# Patient Record
Sex: Female | Born: 1985 | Race: Black or African American | Hispanic: No | Smoking: Never smoker
Health system: Southern US, Community
[De-identification: ages and names within clinical notes are randomized; demographics above are authoritative.]

## PROBLEM LIST (undated history)

## (undated) ENCOUNTER — Emergency Department (HOSPITAL_COMMUNITY): Admission: EM | Payer: 59 | Source: Home / Self Care

## (undated) ENCOUNTER — Inpatient Hospital Stay (HOSPITAL_COMMUNITY): Payer: Self-pay

## (undated) DIAGNOSIS — N83209 Unspecified ovarian cyst, unspecified side: Secondary | ICD-10-CM

## (undated) DIAGNOSIS — B977 Papillomavirus as the cause of diseases classified elsewhere: Secondary | ICD-10-CM

## (undated) DIAGNOSIS — F32A Depression, unspecified: Secondary | ICD-10-CM

## (undated) DIAGNOSIS — K5792 Diverticulitis of intestine, part unspecified, without perforation or abscess without bleeding: Secondary | ICD-10-CM

## (undated) DIAGNOSIS — K449 Diaphragmatic hernia without obstruction or gangrene: Secondary | ICD-10-CM

## (undated) DIAGNOSIS — N3010 Interstitial cystitis (chronic) without hematuria: Secondary | ICD-10-CM

## (undated) DIAGNOSIS — K589 Irritable bowel syndrome without diarrhea: Secondary | ICD-10-CM

## (undated) DIAGNOSIS — K851 Biliary acute pancreatitis without necrosis or infection: Secondary | ICD-10-CM

## (undated) DIAGNOSIS — N946 Dysmenorrhea, unspecified: Secondary | ICD-10-CM

## (undated) HISTORY — DX: Papillomavirus as the cause of diseases classified elsewhere: B97.7

## (undated) HISTORY — DX: Dysmenorrhea, unspecified: N94.6

## (undated) HISTORY — PX: CHOLECYSTECTOMY: SHX55

## (undated) HISTORY — DX: Unspecified ovarian cyst, unspecified side: N83.209

## (undated) HISTORY — PX: WISDOM TOOTH EXTRACTION: SHX21

## (undated) HISTORY — DX: Depression, unspecified: F32.A

## (undated) HISTORY — PX: CYSTOSCOPY: SUR368

## (undated) HISTORY — PX: NASAL ENDOSCOPY: SHX286

---
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
## 2003-01-29 ENCOUNTER — Other Ambulatory Visit: Admission: RE | Admit: 2003-01-29 | Discharge: 2003-01-29 | Payer: Self-pay | Admitting: Obstetrics and Gynecology

## 2003-04-05 HISTORY — PX: BLADDER SURGERY: SHX569

## 2003-06-23 ENCOUNTER — Emergency Department (HOSPITAL_COMMUNITY): Admission: EM | Admit: 2003-06-23 | Discharge: 2003-06-23 | Payer: Self-pay | Admitting: Emergency Medicine

## 2003-07-02 ENCOUNTER — Emergency Department (HOSPITAL_COMMUNITY): Admission: EM | Admit: 2003-07-02 | Discharge: 2003-07-02 | Payer: Self-pay | Admitting: Emergency Medicine

## 2003-08-22 ENCOUNTER — Emergency Department (HOSPITAL_COMMUNITY): Admission: EM | Admit: 2003-08-22 | Discharge: 2003-08-22 | Payer: Self-pay | Admitting: Emergency Medicine

## 2003-11-17 ENCOUNTER — Ambulatory Visit (HOSPITAL_COMMUNITY): Admission: RE | Admit: 2003-11-17 | Discharge: 2003-11-17 | Payer: Self-pay | Admitting: Urology

## 2003-11-17 ENCOUNTER — Ambulatory Visit (HOSPITAL_BASED_OUTPATIENT_CLINIC_OR_DEPARTMENT_OTHER): Admission: RE | Admit: 2003-11-17 | Discharge: 2003-11-17 | Payer: Self-pay | Admitting: Urology

## 2003-11-26 ENCOUNTER — Emergency Department (HOSPITAL_COMMUNITY): Admission: EM | Admit: 2003-11-26 | Discharge: 2003-11-26 | Payer: Self-pay | Admitting: Emergency Medicine

## 2003-12-31 ENCOUNTER — Emergency Department (HOSPITAL_COMMUNITY): Admission: EM | Admit: 2003-12-31 | Discharge: 2003-12-31 | Payer: Self-pay | Admitting: Emergency Medicine

## 2004-01-19 ENCOUNTER — Emergency Department (HOSPITAL_COMMUNITY): Admission: EM | Admit: 2004-01-19 | Discharge: 2004-01-20 | Payer: Self-pay | Admitting: Emergency Medicine

## 2004-01-30 ENCOUNTER — Other Ambulatory Visit: Admission: RE | Admit: 2004-01-30 | Discharge: 2004-01-30 | Payer: Self-pay | Admitting: Obstetrics and Gynecology

## 2004-05-31 ENCOUNTER — Emergency Department (HOSPITAL_COMMUNITY): Admission: EM | Admit: 2004-05-31 | Discharge: 2004-05-31 | Payer: Self-pay | Admitting: Emergency Medicine

## 2004-07-06 ENCOUNTER — Emergency Department (HOSPITAL_COMMUNITY): Admission: EM | Admit: 2004-07-06 | Discharge: 2004-07-06 | Payer: Self-pay | Admitting: Emergency Medicine

## 2004-07-22 ENCOUNTER — Encounter: Admission: RE | Admit: 2004-07-22 | Discharge: 2004-07-22 | Payer: Self-pay | Admitting: Internal Medicine

## 2004-09-10 ENCOUNTER — Emergency Department (HOSPITAL_COMMUNITY): Admission: EM | Admit: 2004-09-10 | Discharge: 2004-09-10 | Payer: Self-pay | Admitting: Emergency Medicine

## 2004-11-18 ENCOUNTER — Emergency Department (HOSPITAL_COMMUNITY): Admission: EM | Admit: 2004-11-18 | Discharge: 2004-11-18 | Payer: Self-pay | Admitting: *Deleted

## 2005-12-13 ENCOUNTER — Emergency Department (HOSPITAL_COMMUNITY): Admission: EM | Admit: 2005-12-13 | Discharge: 2005-12-14 | Payer: Self-pay | Admitting: Emergency Medicine

## 2007-05-04 ENCOUNTER — Emergency Department (HOSPITAL_COMMUNITY): Admission: EM | Admit: 2007-05-04 | Discharge: 2007-05-04 | Payer: Self-pay | Admitting: Emergency Medicine

## 2007-05-12 ENCOUNTER — Emergency Department (HOSPITAL_COMMUNITY): Admission: EM | Admit: 2007-05-12 | Discharge: 2007-05-13 | Payer: Self-pay | Admitting: Emergency Medicine

## 2007-12-25 ENCOUNTER — Emergency Department (HOSPITAL_COMMUNITY): Admission: EM | Admit: 2007-12-25 | Discharge: 2007-12-25 | Payer: Self-pay | Admitting: *Deleted

## 2007-12-27 ENCOUNTER — Emergency Department (HOSPITAL_COMMUNITY): Admission: EM | Admit: 2007-12-27 | Discharge: 2007-12-28 | Payer: Self-pay | Admitting: Emergency Medicine

## 2008-02-27 ENCOUNTER — Encounter: Admission: RE | Admit: 2008-02-27 | Discharge: 2008-02-27 | Payer: Self-pay | Admitting: Obstetrics and Gynecology

## 2008-03-19 ENCOUNTER — Ambulatory Visit (HOSPITAL_COMMUNITY): Admission: RE | Admit: 2008-03-19 | Discharge: 2008-03-19 | Payer: Self-pay | Admitting: Obstetrics and Gynecology

## 2008-04-30 ENCOUNTER — Other Ambulatory Visit: Admission: RE | Admit: 2008-04-30 | Discharge: 2008-04-30 | Payer: Self-pay | Admitting: Obstetrics and Gynecology

## 2008-07-03 ENCOUNTER — Emergency Department (HOSPITAL_COMMUNITY): Admission: EM | Admit: 2008-07-03 | Discharge: 2008-07-03 | Payer: Self-pay | Admitting: Emergency Medicine

## 2008-07-29 ENCOUNTER — Encounter: Admission: RE | Admit: 2008-07-29 | Discharge: 2008-07-29 | Payer: Self-pay | Admitting: Family Medicine

## 2008-11-18 IMAGING — CR DG FOOT COMPLETE 3+V*L*
3 series · 3 of 3 positions shown · non-contrast
Comparison: none

HISTORY: Foot injury, fall, bruising and pain at distal metatarsals

LEFT FOOT 3 VIEWS:
Minimally displaced oblique fracture distal fifth metatarsal.
No articular extension.
Joint spaces preserved.
No additional fracture, dislocation, or bone destruction.

[t foot ap left]
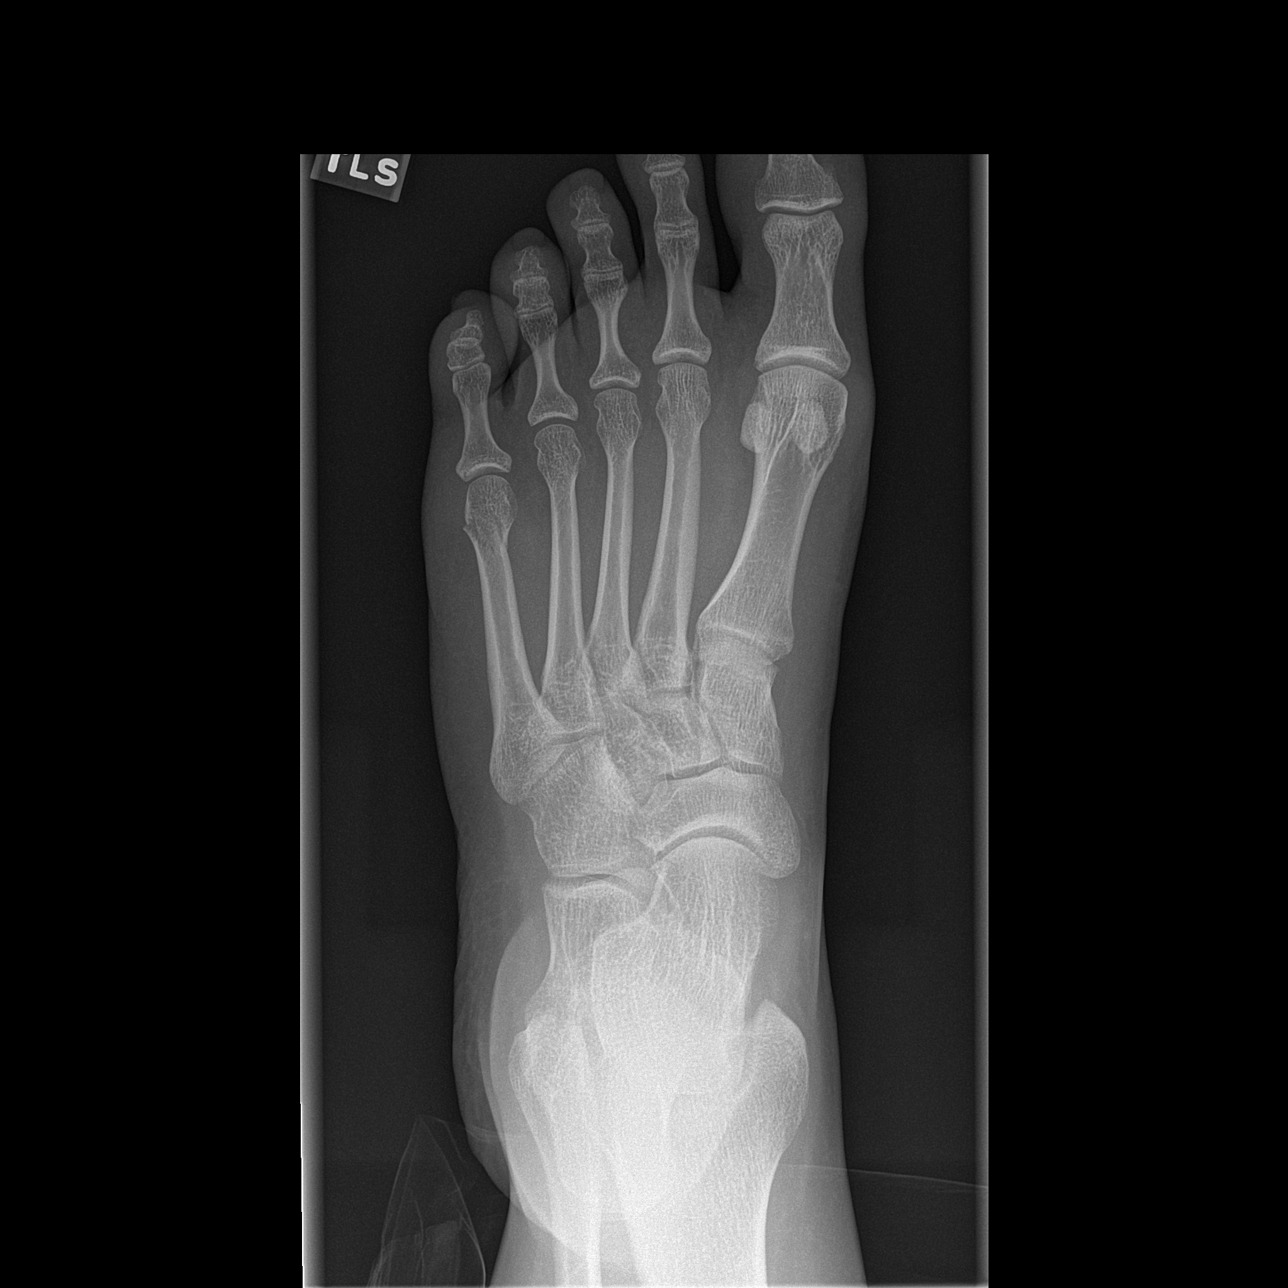

[t foot oblique left]
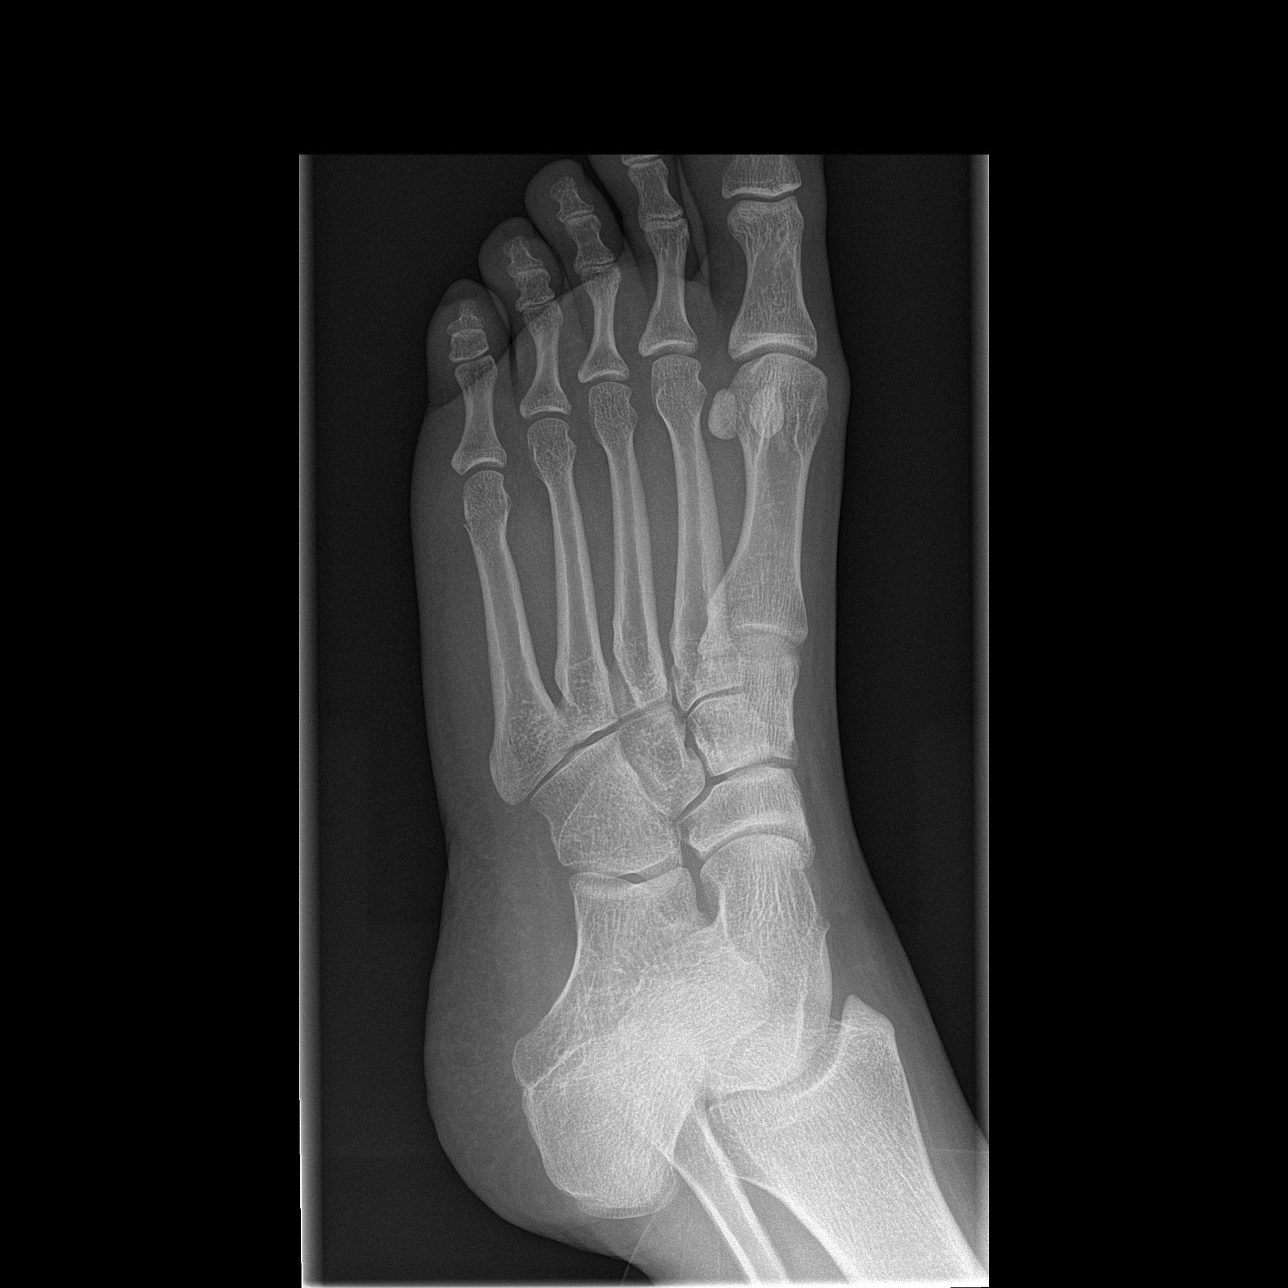

[t foot lat left]
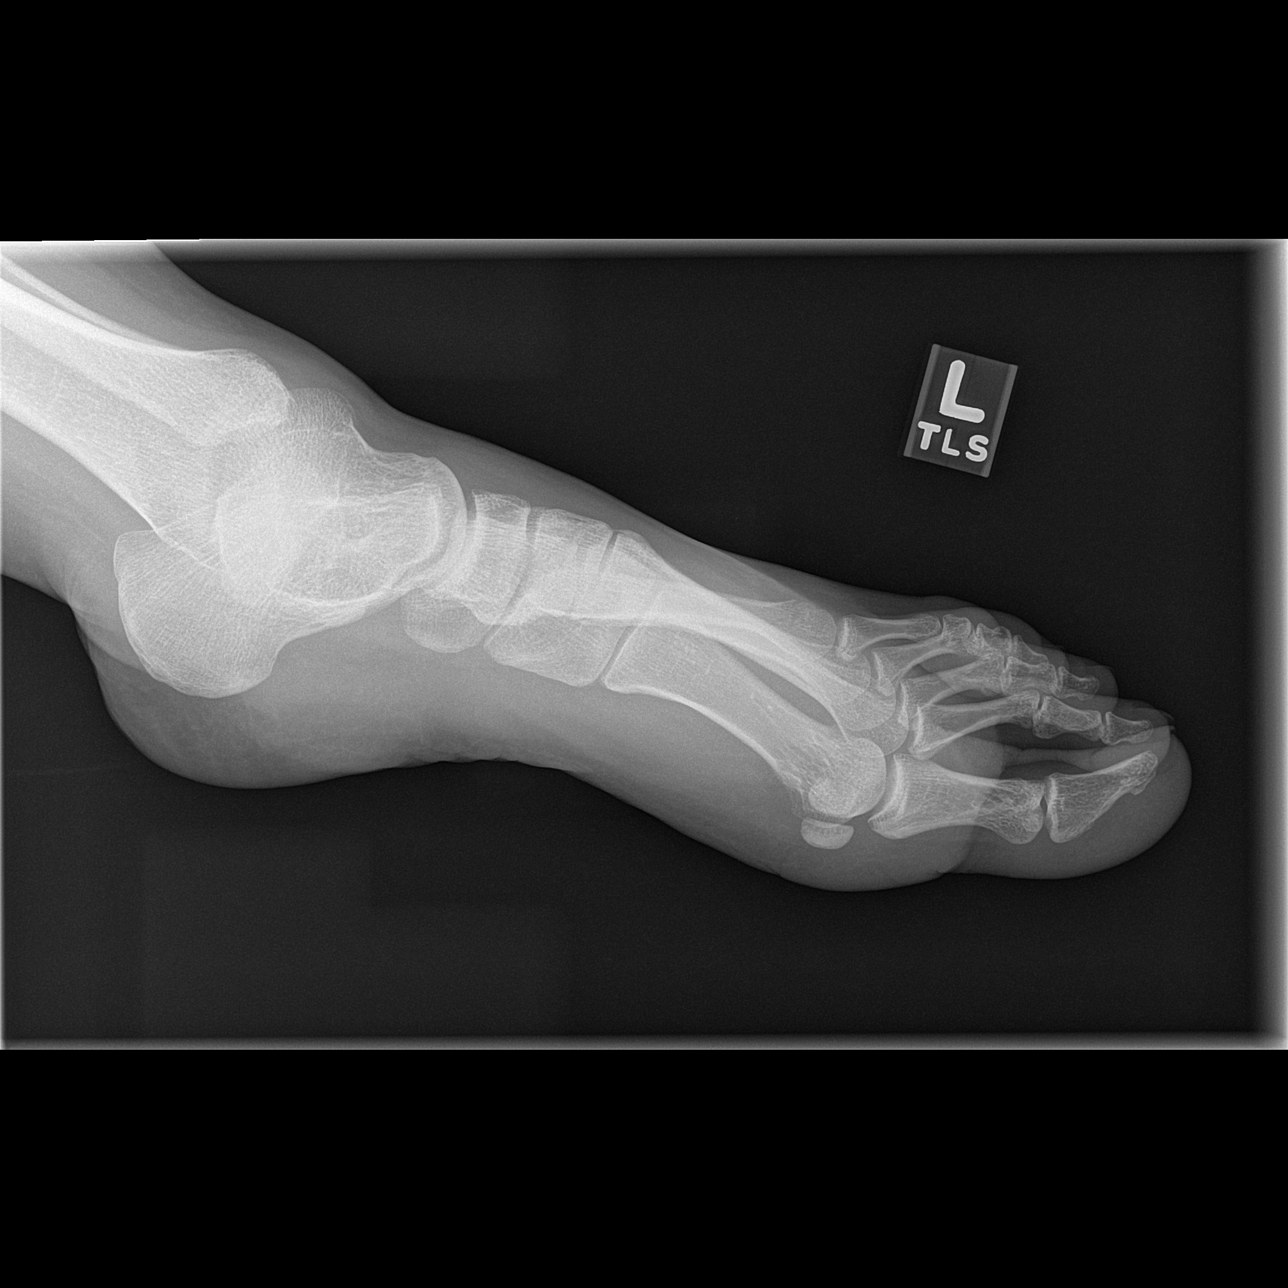

[3 of 3 positions shown; findings below may reference images not displayed]

IMPRESSION: Minimally displaced fracture distal left fifth metatarsal.

## 2009-04-09 ENCOUNTER — Other Ambulatory Visit: Admission: RE | Admit: 2009-04-09 | Discharge: 2009-04-09 | Payer: Self-pay | Admitting: Obstetrics and Gynecology

## 2010-04-25 ENCOUNTER — Encounter: Payer: Self-pay | Admitting: Obstetrics and Gynecology

## 2010-04-25 ENCOUNTER — Encounter: Payer: Self-pay | Admitting: Family Medicine

## 2010-07-04 HISTORY — PX: EXPLORATORY LAPAROTOMY: SUR591

## 2010-07-14 ENCOUNTER — Emergency Department (HOSPITAL_BASED_OUTPATIENT_CLINIC_OR_DEPARTMENT_OTHER)
Admission: EM | Admit: 2010-07-14 | Discharge: 2010-07-14 | Disposition: A | Discharge status: Home / Self Care | Payer: 59 | Attending: Emergency Medicine | Admitting: Emergency Medicine

## 2010-07-14 DIAGNOSIS — N898 Other specified noninflammatory disorders of vagina: Secondary | ICD-10-CM | POA: Insufficient documentation

## 2010-07-14 LAB — URINALYSIS, ROUTINE W REFLEX MICROSCOPIC
Bilirubin Urine: NEGATIVE
Glucose, UA: NEGATIVE mg/dL
Nitrite: NEGATIVE

## 2010-07-14 LAB — PREGNANCY, URINE: Preg Test, Ur: NEGATIVE

## 2010-07-22 ENCOUNTER — Ambulatory Visit (HOSPITAL_COMMUNITY)
Admission: RE | Admit: 2010-07-22 | Discharge: 2010-07-22 | Disposition: A | Discharge status: Home / Self Care | Payer: 59 | Source: Ambulatory Visit | Attending: Obstetrics and Gynecology | Admitting: Obstetrics and Gynecology

## 2010-07-22 ENCOUNTER — Other Ambulatory Visit: Payer: Self-pay | Admitting: Obstetrics and Gynecology

## 2010-07-22 DIAGNOSIS — N926 Irregular menstruation, unspecified: Secondary | ICD-10-CM | POA: Insufficient documentation

## 2010-07-22 DIAGNOSIS — N946 Dysmenorrhea, unspecified: Secondary | ICD-10-CM | POA: Insufficient documentation

## 2010-07-22 DIAGNOSIS — N803 Endometriosis of pelvic peritoneum, unspecified: Secondary | ICD-10-CM | POA: Insufficient documentation

## 2010-07-22 DIAGNOSIS — D259 Leiomyoma of uterus, unspecified: Secondary | ICD-10-CM | POA: Insufficient documentation

## 2010-07-22 LAB — CBC
Hemoglobin: 13.5 g/dL (ref 12.0–15.0)
MCV: 85.5 fL (ref 78.0–100.0)
RBC: 4.69 MIL/uL (ref 3.87–5.11)

## 2010-07-22 LAB — SURGICAL PCR SCREEN: MRSA, PCR: NEGATIVE

## 2010-07-22 LAB — PREGNANCY, URINE: Preg Test, Ur: NEGATIVE

## 2010-07-26 ENCOUNTER — Inpatient Hospital Stay (HOSPITAL_COMMUNITY)
Admission: AD | Admit: 2010-07-26 | Discharge: 2010-07-26 | Disposition: A | Discharge status: Home / Self Care | Payer: 59 | Source: Ambulatory Visit | Attending: Obstetrics and Gynecology | Admitting: Obstetrics and Gynecology

## 2010-07-26 DIAGNOSIS — IMO0002 Reserved for concepts with insufficient information to code with codable children: Secondary | ICD-10-CM | POA: Insufficient documentation

## 2010-07-26 LAB — CBC
HCT: 38.7 % (ref 36.0–46.0)
Hemoglobin: 13.7 g/dL (ref 12.0–15.0)
Platelets: 329 10*3/uL (ref 150–400)
RDW: 13 % (ref 11.5–15.5)
WBC: 12.6 10*3/uL — ABNORMAL HIGH (ref 4.0–10.5)

## 2010-07-26 LAB — URINALYSIS, ROUTINE W REFLEX MICROSCOPIC
Bilirubin Urine: NEGATIVE
Glucose, UA: NEGATIVE mg/dL
Ketones, ur: NEGATIVE mg/dL
Protein, ur: NEGATIVE mg/dL
Specific Gravity, Urine: 1.025 (ref 1.005–1.030)
Urobilinogen, UA: 0.2 mg/dL (ref 0.0–1.0)

## 2010-07-26 LAB — DIFFERENTIAL
Basophils Relative: 0 % (ref 0–1)
Eosinophils Relative: 2 % (ref 0–5)
Lymphs Abs: 3.7 10*3/uL (ref 0.7–4.0)
Monocytes Absolute: 1.2 10*3/uL — ABNORMAL HIGH (ref 0.1–1.0)

## 2010-08-12 NOTE — Op Note (Signed)
Toni Fields                ACCOUNT NO.:  [id number]  MEDICAL RECORD NO.:  [id number]           PATIENT TYPE:  O  LOCATION:  [facility]                          [facility]  PHYSICIAN:  Janine Limbo, M.D.DATE OF BIRTH:  05/19/1985  DATE OF PROCEDURE:  07/22/2010 DATE OF DISCHARGE:                              OPERATIVE REPORT   PREOPERATIVE DIAGNOSES: 1. Dysmenorrhea. 2. Body mass index equals 40. 3. Irregular menstrual cycles.  POSTOPERATIVE DIAGNOSES: 1. Dysmenorrhea. 2. Body mass index equals 40. 3. Irregular menstrual cycles. 4. Stage I endometriosis. 5. Fibroid uterus.  PROCEDURES: 1. Diagnostic laparoscopy. 2. Laparoscopic pelvic biopsies. 3. Laparoscopic fulguration of endometriosis. 4. Chromopertubation.  SURGEON:  Janine Limbo, MD  FIRST ASSISTANT:  None.  ANESTHETIC:  General.  DISPOSITION:  Toni Fields is a 25 year old female, gravida 0, who presents with the above-mentioned diagnoses.  She has noted an increase in her dysmenorrhea.  She understands the indications for her surgical procedure and she accepts the risks of, but not limited to, anesthetic complications, bleeding, infection, and possible damage to the surrounding organs.  FINDINGS:  On laparoscopy, the patient was noted to have the uterus that was normal size and shape.  There was a 0.5-cm and 0.3-cm raised lesion on the left posterior fundus of the uterus and these were consistent with very small fibroids.  The fallopian tubes were normal bilaterally. The fimbriated end of the fallopian tubes were delicate.  The ovaries were normal in size and appearance bilaterally.  The anterior cul-de-sac was free.  The posterior cul-de-sac was free.  No adhesions were noted. There were 3 hyperpigmented lesions in the left posterior cul-de-sac measuring less than 0.3 cm in size.  These lesions were consistent with endometriosis.  There was a 0.5-cm hyperpigmented lesion in the  right posterior cul-de-sac that was slightly raised.  This was consistent with endometriosis.  There was a 0.4-cm lesion on the right pelvic sidewall that was hyperpigmented and it too was consistent with endometriosis. Biopsies were taken of all these areas.  The appendix appeared normal. The gallbladder appeared normal.  The bowel appeared normal.  There was no evidence of endometriosis along the bowel or along the appendix.  On chromopertubation, the fallopian tubes quickly filled with blue dye and dye easily spilled from both fimbriated ends.  PROCEDURE:  The patient was taken to the operating room where a general anesthetic was given.  The patient's abdomen was prepped with ChloraPrep.  The perineum and vagina were prepped with multiple layers of Betadine.  Examination under anesthesia was performed.  A catheter was placed in the patient's bladder.  An acorn cannula was placed inside the cervix.  The patient was then sterilely draped.  The subumbilical area was injected with 4 mL of 0.5% Marcaine with epinephrine.  An incision was made and the Veress needle was inserted into the peritoneal cavity without difficulty.  Proper placement was confirmed using the saline drop test.  A pneumoperitoneum was then obtained.  The laparoscopic trocar and the laparoscope were substituted for the Veress needle.  The pelvic structures were visualized with findings as mentioned above.  The left lower quadrant of the abdomen was injected with 3 mL of 0.5% Marcaine with epinephrine.  A small incision was made and a 5-mm trocar was placed in the lower abdomen under direct visualization.  Again, the pelvic structures were carefully identified. Care was taken not to damage any of the vital pelvic structures or the bowel.  The endometriosis mentioned above was identified.  Pictures were taken.  We then hydrodissected the peritoneum where endometriosis was identified.  Biopsies were taken removing the  lesions that we mentioned above.  We then used the bipolar cautery to cauterize the peritoneal surfaces where endometriosis was thought to exist.  Care was taken not to damage any of the vital structures.  We did not cauterize the bowel. Again, pictures were taken.  At this point, we injected dye into the acorn cannula and dye quickly filled both fallopian tubes and quickly spilled through the fimbriated ends.  Pictures were taken.  We injected a total of 30 mL of dye.  The fluid was then aspirated from the cul-de- sac.  Hemostasis was confirmed throughout.  No additional endometriosis could be seen.  We felt we were ready to end our procedure.  The lower abdominal trocar was removed under direct visualization.  The pneumoperitoneum was allowed to escape.  The subumbilical trocar was then removed under direct visualization.  There was no evidence of herniation of bowel.  The fascia from the subumbilical incision was closed using an interrupted suture of 2-0 Vicryl.  All skin incisions were closed using 3-0 Monocryl.  Dermabond was placed on the skin incisions.  Sponge, needle, and instrument counts were correct on two occasions.  Estimated blood loss for the procedure was less than 5 mL. The patient was awakened from her anesthetic without difficulty and she was transported to the recovery room in stable condition.  She did receive 30 mg of Toradol intramuscularly and 30 mg of Toradol intravenously at the end of the procedure.  The biopsy from the right posterior cul-de-sac, biopsy from the left posterior cul-de-sac, and the biopsy from the right pelvic sidewall wall were all sent to Pathology for evaluation.  FOLLOWUP INSTRUCTIONS:  The patient will return to see Dr. Stefano Gaul in 2 weeks for followup examination.  She was given a copy of the postoperative instruction sheet as prepared by the [facility] for patients who have undergone laparoscopy.  She will call for  questions or concerns.  She was given a prescription for: 1. Motrin 800 mg every 8 hours as needed for mild to moderate pain. 2. Percocet 5/325 one or two tablets every 4 hours as needed for     severe pain. 3. Phenergan 25 mg 1 tablet every 6 hours as needed for nausea.     Janine Limbo, M.D.     AVS/MEDQ  D:  07/22/2010  T:  07/22/2010  Job:  [id number]  Electronically Signed by Kirkland Hun M.D. on 08/12/2010 03:14:07 AM

## 2010-08-12 NOTE — H&P (Signed)
  NAMESHARAY, BELLISSIMO                ACCOUNT NO.:  [id number]  MEDICAL RECORD NO.:  [id number]           PATIENT TYPE:  LOCATION:                                [facility]ILITY:  [facility]  PHYSICIAN:  Janine Limbo, M.D.DATE OF BIRTH:  1985/12/06  DATE OF ADMISSION:  07/22/2010 DATE OF DISCHARGE:                             HISTORY & PHYSICAL   DIAGNOSIS:  Obesity.  HISTORY OF PRESENT ILLNESS:  Toni Fields is a 25 year old female, gravida 0, who presents for a diagnostic laparoscopy with chromopertubation. The patient has been followed at the [facility].  The patient complains of increasing dysmenorrhea that has not responded to nonsteroidal anti-inflammatory agents.  The patient also has a history of irregular menstrual cycle and infertility.  The patient has been evaluated by a reproductive endocrinologist.  The patient has had negative gonorrhea and chlamydia cultures.  The patient has a past history of painful urethral syndrome and a past history of irritable bowel syndrome.  The patient has been told in the past that she has polycystic ovary syndrome.  DRUG ALLERGIES:  The patient reports that she is allergic to SULFA MEDICATIONS and AUGMENTIN.  PAST MEDICAL HISTORY:  Please see history of present illness.  The patient is obese.  Patient has a history of hidradenitis.  SOCIAL HISTORY:  The patient denies cigarette use and recreational drug use other than social alcohol.  She is currently married.  REVIEW OF SYSTEMS:  Please see history of present illness.  FAMILY HISTORY:  The patient has a family history of heart disease, asthma, thyroid dysfunction, cancer, hypertension, diabetes, mental and emotional problems, liver problems, and headaches.  PHYSICAL EXAMINATION:  VITAL SIGNS:  Weight is 245 pounds.  Height is 5 feet 6 inches.  BMI is approximately 40. HEENT:  Within normal limits except for an increased amount of hair  on her face and chin.  Thyroid is within normal limits. CHEST:  Clear. HEART:  Regular rate and rhythm. BREASTS:  Without masses.  The patient has healed scars under her breasts. ABDOMEN:  Nontender. EXTREMITIES:  Grossly normal. NEUROLOGIC:  Grossly normal. PELVIC:  External genitalia is normal.  Vagina is normal.  Cervix is nontender.  Uterus is normal size, shape and consistency.  Adnexa, no masses.  ASSESSMENT: 1. Increasing dysmenorrhea. 2. Irregular menstrual cycles. 3. Infertility.  PLAN:  The patient will undergo a diagnostic laparoscopy.  She understands the indications for her surgical procedure and she accepts the risks, but not limited to, anesthetic complications, bleeding, infections, and possible damage to the surrounding organs.     Janine Limbo, M.D.     AVS/MEDQ  D:  07/21/2010  T:  07/21/2010  Job:  [id number]  Electronically Signed by Kirkland Hun M.D. on 08/12/2010 03:13:55 AM

## 2010-08-20 NOTE — Op Note (Signed)
NAME:  Toni Fields, Toni Fields                           ACCOUNT NO.:  [id number]   MEDICAL RECORD NO.:  [id number]                   PATIENT TYPE:  AMB   LOCATION:  [facility]                                 FACILITY:  [facility]   PHYSICIAN:  Lindaann Slough, M.D.               DATE OF BIRTH:  1985/09/16   DATE OF PROCEDURE:  11/17/2003  DATE OF DISCHARGE:                                 OPERATIVE REPORT   PREOPERATIVE DIAGNOSIS:  Pelvic pain.   POSTOPERATIVE DIAGNOSIS:  Pelvic pain plus interstitial cystitis.   OPERATION PERFORMED:  Cystoscopy and hydraulic bladder distention and  ureteral dilation.   SURGEON:  Lindaann Slough, M.D.   ANESTHESIA:  General.   INDICATIONS FOR PROCEDURE:  The patient is a 25 year old female who has been  complaining of suprapubic pain, frequency, urgency and sensation of  incomplete emptying of the bladder.  She was treated with antibiotics  without any improvement.  She is scheduled today for cystoscopy and  hydraulic bladder distention.   DESCRIPTION OF PROCEDURE:  Under general anesthesia, the patient was prepped  and draped and placed in the dorsal lithotomy position.  A #22 [place]  cystoscope was inserted in the bladder. The bladder mucosa was reddened.  There was evidence of submucosal glomerulerations.  There is no stone or  tumor in the bladder.  Uteral orifices are normal in position and shape with  clear efflux.  The bladder was then distended for about 10 minutes.  The  bladder capacity is about 800 mL.   The cystoscope was removed.  The urethra was then dilated with a #30 [place],  then 400 mg of Pyridium in 20 mL of Marcaine were instilled in the bladder.  The patient tolerated the procedure well and left the operating room in  satisfactory condition to post anesthesia care unit.                                               Lindaann Slough, M.D.    MN/MEDQ  D:  11/17/2003  T:  11/17/2003  Job:  [id number]   cc:   [facility] ob-[facility]yn

## 2010-09-14 ENCOUNTER — Ambulatory Visit
Admission: RE | Admit: 2010-09-14 | Discharge: 2010-09-14 | Disposition: A | Discharge status: Home / Self Care | Payer: 59 | Source: Ambulatory Visit | Attending: Family Medicine | Admitting: Family Medicine

## 2010-09-14 ENCOUNTER — Other Ambulatory Visit: Payer: Self-pay | Admitting: Family Medicine

## 2010-09-14 DIAGNOSIS — K5792 Diverticulitis of intestine, part unspecified, without perforation or abscess without bleeding: Secondary | ICD-10-CM

## 2010-09-14 MED ORDER — IOHEXOL 300 MG/ML  SOLN
125.0000 mL | Freq: Once | INTRAMUSCULAR | Status: AC | PRN
  Administered 2010-09-14: 125 mL via INTRAVENOUS

## 2010-09-17 ENCOUNTER — Other Ambulatory Visit (HOSPITAL_COMMUNITY): Payer: Self-pay | Admitting: Family Medicine

## 2010-09-24 ENCOUNTER — Other Ambulatory Visit: Payer: Self-pay | Admitting: Gastroenterology

## 2010-09-27 ENCOUNTER — Ambulatory Visit
Admission: RE | Admit: 2010-09-27 | Discharge: 2010-09-27 | Disposition: A | Discharge status: Home / Self Care | Payer: 59 | Source: Ambulatory Visit | Attending: Gastroenterology | Admitting: Gastroenterology

## 2010-10-04 ENCOUNTER — Ambulatory Visit (HOSPITAL_COMMUNITY)
Admission: RE | Admit: 2010-10-04 | Discharge: 2010-10-04 | Disposition: A | Discharge status: Home / Self Care | Payer: 59 | Source: Ambulatory Visit | Attending: Family Medicine | Admitting: Family Medicine

## 2010-10-04 ENCOUNTER — Encounter (HOSPITAL_COMMUNITY): Payer: Self-pay

## 2010-10-04 DIAGNOSIS — R109 Unspecified abdominal pain: Secondary | ICD-10-CM | POA: Insufficient documentation

## 2010-10-04 MED ORDER — SINCALIDE 5 MCG IJ SOLR: 0.0200 ug/kg | Freq: Once | INTRAMUSCULAR | Status: DC

## 2010-10-04 MED ORDER — TECHNETIUM TC 99M MEBROFENIN IV KIT
5.5000 | PACK | Freq: Once | INTRAVENOUS | Status: AC | PRN
  Administered 2010-10-04: 5.5 via INTRAVENOUS

## 2010-11-24 ENCOUNTER — Emergency Department (HOSPITAL_COMMUNITY)
Admission: EM | Admit: 2010-11-24 | Discharge: 2010-11-24 | Disposition: A | Discharge status: Home / Self Care | Payer: 59 | Attending: Emergency Medicine | Admitting: Emergency Medicine

## 2010-11-24 DIAGNOSIS — K2970 Gastritis, unspecified, without bleeding: Secondary | ICD-10-CM | POA: Insufficient documentation

## 2010-11-24 DIAGNOSIS — R1013 Epigastric pain: Secondary | ICD-10-CM | POA: Insufficient documentation

## 2010-11-24 LAB — DIFFERENTIAL
Basophils Relative: 0 % (ref 0–1)
Eosinophils Absolute: 0.2 10*3/uL (ref 0.0–0.7)
Lymphs Abs: 2.6 10*3/uL (ref 0.7–4.0)
Monocytes Absolute: 1.1 10*3/uL — ABNORMAL HIGH (ref 0.1–1.0)
Monocytes Relative: 9 % (ref 3–12)

## 2010-11-24 LAB — COMPREHENSIVE METABOLIC PANEL
ALT: 14 U/L (ref 0–35)
AST: 13 U/L (ref 0–37)
CO2: 25 mEq/L (ref 19–32)
Calcium: 9.3 mg/dL (ref 8.4–10.5)
Creatinine, Ser: 0.51 mg/dL (ref 0.50–1.10)
GFR calc Af Amer: >60 mL/min (ref >60)
GFR calc non Af Amer: >60 mL/min (ref >60)
Glucose, Bld: 81 mg/dL (ref 70–99)
Sodium: 141 mEq/L (ref 135–145)
Total Protein: 6.7 g/dL (ref 6.0–8.3)

## 2010-11-24 LAB — URINALYSIS, ROUTINE W REFLEX MICROSCOPIC
Bilirubin Urine: NEGATIVE
Glucose, UA: NEGATIVE mg/dL
Hgb urine dipstick: NEGATIVE
Ketones, ur: 40 mg/dL — AB
Protein, ur: NEGATIVE mg/dL
Urobilinogen, UA: 0.2 mg/dL (ref 0.0–1.0)

## 2010-11-24 LAB — CBC
MCH: 30 pg (ref 26.0–34.0)
MCHC: 34.9 g/dL (ref 30.0–36.0)
MCV: 85.9 fL (ref 78.0–100.0)
Platelets: 301 10*3/uL (ref 150–400)
RBC: 4.47 MIL/uL (ref 3.87–5.11)

## 2010-11-25 LAB — POCT PREGNANCY, URINE: Preg Test, Ur: NEGATIVE

## 2011-01-03 LAB — CSF CELL COUNT WITH DIFFERENTIAL
Tube #: 1
WBC, CSF: 0
WBC, CSF: 1

## 2011-01-03 LAB — CBC
Platelets: 308
RDW: 13.5
WBC: 10

## 2011-01-03 LAB — CSF CULTURE W GRAM STAIN
Culture: NO GROWTH
Gram Stain: NONE SEEN

## 2011-01-03 LAB — URINALYSIS, ROUTINE W REFLEX MICROSCOPIC
Glucose, UA: NEGATIVE
Ketones, ur: 40 — AB
Nitrite: NEGATIVE
Protein, ur: NEGATIVE
Specific Gravity, Urine: 1.023
pH: 6

## 2011-01-03 LAB — APTT: aPTT: 34

## 2011-01-03 LAB — POCT PREGNANCY, URINE: Preg Test, Ur: NEGATIVE

## 2011-01-03 LAB — DIFFERENTIAL
Basophils Absolute: 0
Lymphocytes Relative: 32
Lymphs Abs: 3.2
Neutro Abs: 5.8
Neutrophils Relative %: 58

## 2011-01-03 LAB — PROTIME-INR
INR: 1.1
Prothrombin Time: 14.1

## 2011-01-03 LAB — GRAM STAIN

## 2011-01-03 LAB — BASIC METABOLIC PANEL
CO2: 25
Calcium: 9.8
Creatinine, Ser: 0.66
GFR calc Af Amer: >60
GFR calc non Af Amer: >60
Sodium: 140

## 2011-01-03 LAB — URINE MICROSCOPIC-ADD ON

## 2011-01-03 LAB — PROTEIN AND GLUCOSE, CSF: Total  Protein, CSF: 21

## 2011-03-15 ENCOUNTER — Encounter (HOSPITAL_BASED_OUTPATIENT_CLINIC_OR_DEPARTMENT_OTHER): Payer: Self-pay | Admitting: *Deleted

## 2011-03-15 ENCOUNTER — Emergency Department (HOSPITAL_BASED_OUTPATIENT_CLINIC_OR_DEPARTMENT_OTHER)
Admission: EM | Admit: 2011-03-15 | Discharge: 2011-03-15 | Disposition: A | Discharge status: Home / Self Care | Payer: Managed Care, Other (non HMO) | Attending: Emergency Medicine | Admitting: Emergency Medicine

## 2011-03-15 DIAGNOSIS — B9789 Other viral agents as the cause of diseases classified elsewhere: Secondary | ICD-10-CM | POA: Insufficient documentation

## 2011-03-15 DIAGNOSIS — K589 Irritable bowel syndrome without diarrhea: Secondary | ICD-10-CM | POA: Insufficient documentation

## 2011-03-15 DIAGNOSIS — B349 Viral infection, unspecified: Secondary | ICD-10-CM

## 2011-03-15 DIAGNOSIS — R197 Diarrhea, unspecified: Secondary | ICD-10-CM | POA: Insufficient documentation

## 2011-03-15 DIAGNOSIS — Z79899 Other long term (current) drug therapy: Secondary | ICD-10-CM | POA: Insufficient documentation

## 2011-03-15 HISTORY — DX: Interstitial cystitis (chronic) without hematuria: N30.10

## 2011-03-15 HISTORY — DX: Irritable bowel syndrome, unspecified: K58.9

## 2011-03-15 HISTORY — DX: Diaphragmatic hernia without obstruction or gangrene: K44.9

## 2011-03-15 LAB — URINALYSIS, ROUTINE W REFLEX MICROSCOPIC
Bilirubin Urine: NEGATIVE
Glucose, UA: NEGATIVE mg/dL
Hgb urine dipstick: NEGATIVE
Ketones, ur: NEGATIVE mg/dL
Nitrite: NEGATIVE
Specific Gravity, Urine: 1.027 (ref 1.005–1.030)
pH: 6 (ref 5.0–8.0)

## 2011-03-15 LAB — BASIC METABOLIC PANEL
CO2: 23 mEq/L (ref 19–32)
Chloride: 106 mEq/L (ref 96–112)
GFR calc non Af Amer: >90 mL/min (ref >90)
Glucose, Bld: 85 mg/dL (ref 70–99)
Potassium: 3.6 mEq/L (ref 3.5–5.1)
Sodium: 138 mEq/L (ref 135–145)

## 2011-03-15 MED ORDER — ACETAMINOPHEN 500 MG PO TABS
1000.0000 mg | ORAL_TABLET | Freq: Once | ORAL | Status: AC
  Administered 2011-03-15: 1000 mg via ORAL
  Filled 2011-03-15: qty 2

## 2011-03-15 NOTE — ED Notes (Signed)
I took urine sample to lab.

## 2011-03-15 NOTE — ED Provider Notes (Signed)
History     CSN: [id number] Arrival date & time: 03/15/2011 10:22 AM   First MD Initiated Contact with Patient 03/15/11 1058      Chief Complaint  Patient presents with  . Diarrhea    body aches chills sinus headache    (Consider location/radiation/quality/duration/timing/severity/associated sxs/prior treatment) HPI Complains of diarrhea and headache onset 2 days ago . Associated symptoms include diffuse myalgias and chills no known fever no bowel pain no other complaint. Treated herself with Tylenol 6 AM this morning with transient relief nothing makes symptoms better or worse. No nausea or vomiting. No other complaint. No other associated symptom Past Medical History  Diagnosis Date  . Abdominal pain   . IBS (irritable bowel syndrome)   . Hernia, hiatal   . Migraine   . Endometriosis   . IC (interstitial cystitis)     Past Surgical History  Procedure Date  . Exploratory laparotomy 4/12    endometrosis  . Cystoscopy     No family history on file.  History  Substance Use Topics  . Smoking status: Former Games developer  . Smokeless tobacco: Not on file  . Alcohol Use: No    OB History    Grav Para Term Preterm Abortions TAB SAB Ect Mult Living                  Review of Systems  Constitutional: Negative.   HENT: Negative.   Respiratory: Negative.   Cardiovascular: Negative.   Gastrointestinal: Positive for diarrhea. Negative for blood in stool.  Musculoskeletal: Negative.  Negative for myalgias.  Skin: Negative.   Neurological:       Headache  Hematological: Negative.   Psychiatric/Behavioral: Negative.   All other systems reviewed and are negative.    Allergies  Augmentin and Sulfa antibiotics  Home Medications   Current Outpatient Rx  Name Route Sig Dispense Refill  . ACETAMINOPHEN 500 MG PO TABS Oral Take 1,000 mg by mouth every 6 (six) hours as needed. For headache     . OMEPRAZOLE 10 MG PO CPDR Oral Take 10 mg by mouth daily as needed. For  heartburn    . PRENATAL FORMULA PO Oral Take 1 tablet by mouth daily.      . SUCRALFATE 1 G PO TABS Oral Take 1 g by mouth 4 (four) times daily as needed. For IBS    . TRAMADOL HCL 100 MG PO TB24 Oral Take 100 mg by mouth daily as needed. For menstral pain      BP 120/75  Pulse 73  Temp(Src) 97.9 F (36.6 C) (Oral)  Resp 18  Ht 5\' 6"  (1.676 m)  Wt 225 lb (102.059 kg)  BMI 36.32 kg/m2  SpO2 99%  LMP 02/27/2011  Physical Exam  Nursing note and vitals reviewed. Constitutional: She appears well-developed and well-nourished.  HENT:  Head: Normocephalic and atraumatic.  Eyes: Conjunctivae and EOM are normal. Pupils are equal, round, and reactive to light.  Neck: Neck supple. No tracheal deviation present. No thyromegaly present.  Cardiovascular: Normal rate and regular rhythm.   No murmur heard. Pulmonary/Chest: Effort normal and breath sounds normal.  Abdominal: Soft. Bowel sounds are normal. She exhibits no distension. There is no tenderness.       obese  Musculoskeletal: Normal range of motion. She exhibits no edema and no tenderness.  Lymphadenopathy:    She has no cervical adenopathy.  Neurological: She is alert. Coordination normal.  Skin: Skin is warm and dry. No rash noted.  Psychiatric: She  has a normal mood and affect.    ED Course  Procedures (including critical care time) 12:30 PM Feels improved after treatment with Tylenol  Labs Reviewed  URINALYSIS, ROUTINE W REFLEX MICROSCOPIC  PREGNANCY, URINE   No results found.   No diagnosis found.  Results for orders placed during the hospital encounter of 03/15/11  URINALYSIS, ROUTINE W REFLEX MICROSCOPIC      Component Value Range   Color, Urine YELLOW  YELLOW    APPearance CLEAR  CLEAR    Specific Gravity, Urine 1.027  1.005 - 1.030    pH 6.0  5.0 - 8.0    Glucose, UA NEGATIVE  NEGATIVE (mg/dL)   Hgb urine dipstick NEGATIVE  NEGATIVE    Bilirubin Urine NEGATIVE  NEGATIVE    Ketones, ur NEGATIVE  NEGATIVE  (mg/dL)   Protein, ur NEGATIVE  NEGATIVE (mg/dL)   Urobilinogen, UA 0.2  0.0 - 1.0 (mg/dL)   Nitrite NEGATIVE  NEGATIVE    Leukocytes, UA NEGATIVE  NEGATIVE   PREGNANCY, URINE      Component Value Range   Preg Test, Ur NEGATIVE    BASIC METABOLIC PANEL      Component Value Range   Sodium 138  135 - 145 (mEq/L)   Potassium 3.6  3.5 - 5.1 (mEq/L)   Chloride 106  96 - 112 (mEq/L)   CO2 23  19 - 32 (mEq/L)   Glucose, Bld 85  70 - 99 (mg/dL)   BUN 12  6 - 23 (mg/dL)   Creatinine, Ser 1.61  0.50 - 1.10 (mg/dL)   Calcium 9.0  8.4 - 09.6 (mg/dL)   GFR calc non Af Amer >90  >90 (mL/min)   GFR calc Af Amer >90  >90 (mL/min)   No results found.   MDM  Symptoms consistent with viral illness Plan Imodium  encourage by mouth fluids Tylenol for aches Diagnosis viral illness        Doug Sou, MD 03/15/11 1236

## 2011-03-15 NOTE — ED Notes (Signed)
Patient states she developed diarrhea on Sunday, yesterday she had body chills and sinus headache.

## 2011-08-02 ENCOUNTER — Ambulatory Visit (INDEPENDENT_AMBULATORY_CARE_PROVIDER_SITE_OTHER): Payer: Managed Care, Other (non HMO) | Admitting: Obstetrics and Gynecology

## 2011-08-02 ENCOUNTER — Encounter: Payer: Self-pay | Admitting: Obstetrics and Gynecology

## 2011-08-02 VITALS — BP 110/64 | Temp 98.0°F | Resp 16 | Ht 66.0 in | Wt 220.0 lb

## 2011-08-02 DIAGNOSIS — E669 Obesity, unspecified: Secondary | ICD-10-CM

## 2011-08-02 DIAGNOSIS — R102 Pelvic and perineal pain: Secondary | ICD-10-CM

## 2011-08-02 DIAGNOSIS — N809 Endometriosis, unspecified: Secondary | ICD-10-CM | POA: Insufficient documentation

## 2011-08-02 DIAGNOSIS — L732 Hidradenitis suppurativa: Secondary | ICD-10-CM | POA: Insufficient documentation

## 2011-08-02 DIAGNOSIS — N949 Unspecified condition associated with female genital organs and menstrual cycle: Secondary | ICD-10-CM

## 2011-08-02 DIAGNOSIS — N979 Female infertility, unspecified: Secondary | ICD-10-CM

## 2011-08-02 DIAGNOSIS — N946 Dysmenorrhea, unspecified: Secondary | ICD-10-CM

## 2011-08-02 LAB — POCT URINALYSIS DIPSTICK
Bilirubin, UA: NEGATIVE
Glucose, UA: NEGATIVE
Spec Grav, UA: 1.02
Urobilinogen, UA: NEGATIVE

## 2011-08-02 LAB — POCT URINE PREGNANCY: Preg Test, Ur: NEGATIVE

## 2011-08-02 MED ORDER — OXYCODONE-ACETAMINOPHEN 5-325 MG PO TABS: 1.0000 | ORAL_TABLET | ORAL | Status: AC | PRN

## 2011-08-02 MED ORDER — NORETHINDRONE ACETATE 5 MG PO TABS: 5.0000 mg | ORAL_TABLET | Freq: Every day | ORAL | Status: DC

## 2011-08-02 MED ORDER — IBUPROFEN 200 MG PO TABS: 800.0000 mg | ORAL_TABLET | Freq: Three times a day (TID) | ORAL | Status: AC | PRN

## 2011-08-02 NOTE — Progress Notes (Signed)
Vag. Discharge:no Odor:no Fever:no Irreg.Periods:yes Dyspareunia:no Dysuria:no Frequency:no Urgency:no Hematuria:no Kidney stones:no Constipation:no Diarrhea:yes Rectal Bleeding: no Vomiting:yes Nausea:yes Pregnant:no Fibroids:yes Endometriosis:yes Hx of Ovarian Cyst:no Hx IUD:no Hx STD-PID:no Appendectomy:yes Gall Bladder Dz:no  Toni Fields is a 26 y.o. year old female,No obstetric history on file., who presents for a problem visit.  Subjective:  The patient reports that her dysmenorrhea is getting progressively worse.  It is now difficult for her to work.  The patient has had a laparoscopy before and was found to have endometriosis.  She has a history of infertility.  Objective:  BP 110/64  Temp 98 F (36.7 C)  Resp 16  Ht 5\' 6"  (1.676 m)  Wt 220 lb (99.791 kg)  BMI 35.51 kg/m2  LMP 07/02/2011   General: alert, cooperative and no distress Resp: clear to auscultation bilaterally Cardio: regular rate and rhythm, S1, S2 normal, no murmur, click, rub or gallop GI: soft, non-tender; bowel sounds normal; no masses,  no organomegaly  External genitalia: normal general appearance Vaginal: normal mucosa without prolapse or lesions Cervix: normal appearance Adnexa: normal bimanual exam Uterus: tender and upper limits normal size  Pregnancy test: Negative  UA: Negative  Assessment:  Dysmenorrhea  Endometriosis  Obesity  Infertility  Plan:  The medical and surgical management of dysmenorrhea and endometriosis were reviewed.  Risk and benefits of each options were outlined.  The patient was to proceed with Lupron therapy.  We will obtain an authorization for 3 months of therapy.  We will also use Aygestin add back therapy.  Motrin and Percocet he mailed to the pharmacy.  Return to office in 1 week(s).   Leonard Schwartz M.D.  08/02/2011 9:38 PM

## 2011-08-03 ENCOUNTER — Other Ambulatory Visit: Payer: Self-pay | Admitting: Obstetrics and Gynecology

## 2011-08-03 ENCOUNTER — Telehealth: Payer: Self-pay | Admitting: Obstetrics and Gynecology

## 2011-08-03 MED ORDER — IBUPROFEN 800 MG PO TABS: 800.0000 mg | ORAL_TABLET | Freq: Three times a day (TID) | ORAL | Status: AC | PRN

## 2011-08-03 NOTE — Telephone Encounter (Signed)
Routed to pam/tyvonna

## 2011-08-03 NOTE — Telephone Encounter (Signed)
PT CALLED, SAYS WAS GIVEN A RX FOR IBUPROFEN BY [facility], WHEN SHE ARRIVED @ PHARMACY WAS TOLD IT WAS OTC ADVIL AND PHARMACY WOULD NOT FILL IT, PT STATES WANTS THE PRESCRIBED 800 MG IBUPROFEN TABS, PER [facility] CAN E-PRESCRIBE MED TO PT'S PHARMACY (SEE MED LIST).  PT MADE AWARE RX IS BEING FILLED @ PHARMACY, PT VOICES UNDERSTAND.

## 2011-08-03 NOTE — Telephone Encounter (Signed)
TC TO PT REGARDING MSG ABOUT HER MEDICATION. LM ON VM TO CALL BACK

## 2011-08-12 ENCOUNTER — Other Ambulatory Visit: Payer: Self-pay | Admitting: Obstetrics and Gynecology

## 2011-08-12 MED ORDER — LEUPROLIDE ACETATE (3 MONTH) 11.25 MG IM KIT: 11.2500 mg | PACK | INTRAMUSCULAR | Status: AC

## 2011-08-15 ENCOUNTER — Telehealth: Payer: Self-pay | Admitting: Obstetrics and Gynecology

## 2011-08-15 NOTE — Telephone Encounter (Signed)
TC TO [facility] TO ORDER LUPRON DEPOT PER [facility].  ALL PERTINENT INFO GIVEN, PT TO RECEIVE CALL FROM INSURANCE COMPANY REGARDING HER COVERAGE, PT CALLED AND MADE AWARE OF THIS.

## 2011-09-05 ENCOUNTER — Encounter: Payer: Managed Care, Other (non HMO) | Admitting: Obstetrics and Gynecology

## 2011-09-26 ENCOUNTER — Encounter: Payer: Managed Care, Other (non HMO) | Admitting: Obstetrics and Gynecology

## 2011-10-07 ENCOUNTER — Telehealth: Payer: Self-pay | Admitting: Obstetrics and Gynecology

## 2011-10-07 ENCOUNTER — Other Ambulatory Visit: Payer: Managed Care, Other (non HMO)

## 2011-10-07 DIAGNOSIS — N912 Amenorrhea, unspecified: Secondary | ICD-10-CM

## 2011-10-07 NOTE — Telephone Encounter (Signed)
TC to pt.   States LMP 09/03/11 or 09/05/11.  Had +UPT last PM and then 2 neg UPT.   NO spotting.  Has pain but has endometriosis and no change in pain.  To consult with provider. After consult with [facility], pt scheduled for [facility] today.   Informed not available until 10/10/11.   Pt verbalizes comprehension.

## 2011-10-08 LAB — HCG, QUANTITATIVE, PREGNANCY: hCG, Beta Chain, Quant, S: <2 m[IU]/mL

## 2011-10-10 ENCOUNTER — Telehealth: Payer: Self-pay | Admitting: Obstetrics and Gynecology

## 2011-10-10 NOTE — Telephone Encounter (Signed)
Tc to pt regarding message. Pt want lab results for quant. Informed pt that her quant was negative. Pt voiced understanding

## 2011-12-01 ENCOUNTER — Encounter: Payer: Self-pay | Admitting: Obstetrics and Gynecology

## 2011-12-01 ENCOUNTER — Ambulatory Visit (INDEPENDENT_AMBULATORY_CARE_PROVIDER_SITE_OTHER): Payer: Managed Care, Other (non HMO) | Admitting: Obstetrics and Gynecology

## 2011-12-01 VITALS — BP 110/70 | Temp 97.9°F | Wt 210.0 lb

## 2011-12-01 DIAGNOSIS — R102 Pelvic and perineal pain: Secondary | ICD-10-CM

## 2011-12-01 DIAGNOSIS — N3010 Interstitial cystitis (chronic) without hematuria: Secondary | ICD-10-CM

## 2011-12-01 DIAGNOSIS — N949 Unspecified condition associated with female genital organs and menstrual cycle: Secondary | ICD-10-CM

## 2011-12-01 DIAGNOSIS — N809 Endometriosis, unspecified: Secondary | ICD-10-CM

## 2011-12-01 LAB — POCT URINALYSIS DIPSTICK
Protein, UA: NEGATIVE
Spec Grav, UA: 1.02
Urobilinogen, UA: NEGATIVE

## 2011-12-01 LAB — POCT URINE PREGNANCY: Preg Test, Ur: NEGATIVE

## 2011-12-01 MED ORDER — NORETHINDRONE ACETATE 5 MG PO TABS: 5.0000 mg | ORAL_TABLET | Freq: Every day | ORAL | Status: DC

## 2011-12-01 MED ORDER — TRAMADOL HCL ER 100 MG PO TB24: 100.0000 mg | ORAL_TABLET | Freq: Every day | ORAL | Status: DC | PRN

## 2011-12-01 NOTE — Progress Notes (Signed)
Contraception: none History of STD:  no history of PID, STD's History of ovarian cyst: no History of fibroids: yes:  07/22/2010 History of endometriosis:yes:  07/22/10 Previous ultrasound:no  Urinary symptoms: urinary frequency Gastro-intestinal symptoms:  Constipation: yes     Diarrhea: yes     Nausea: yes     Vomiting: yes     Fever: no Vaginal discharge: no vaginal discharge

## 2011-12-01 NOTE — Progress Notes (Signed)
26 YO with history of endometriosis and interstitial cystitis complains of pelvic pain.   Pain present x 2 weeks constantly, typically twice a week  only with relief from Tramdol.  Now sex is excruciating and with current menses pain is much worse. Initially pain was rated as 6/10 but increased to 10/10 with Tramadol decreasing to 3/10.  Admits to urinary frequency and mild dysuria, constipation alternating with loose stools (has IBS & hiatal hernia) along with nausea & vomiting related to her GI issues.  Denies fever and has no relief from stabbing burning pain with intercourse with position change.   Menses: 3-5 days with pad change 1- 5 times daily;  they occur regular except LMP "end of month" instead of beginning of month  O:  UPT-negative       U/A- pH- 5.0,  SG- 1.020, ketones & blood-trace otherwise negative  Abdomen:  Pelvic: EGBUS-  A:  Increased Chronic Pelvic Pain      Endometriosis      Interstitial Cystitis      GI Dysfunction      Desire to conceive  P:  Discussed with Dr. Stefano Gaul patient's presentation and options       Reviewed options with patient: Lupron, progestins, repeat laparoscopy, BCPs, observation       Patient wants to try progestins

## 2012-03-12 ENCOUNTER — Telehealth: Payer: Self-pay | Admitting: Obstetrics and Gynecology

## 2012-03-12 ENCOUNTER — Other Ambulatory Visit: Payer: Self-pay | Admitting: Obstetrics and Gynecology

## 2012-03-12 MED ORDER — TRAMADOL HCL 50 MG PO TABS
50.0000 mg | ORAL_TABLET | Freq: Four times a day (QID) | ORAL | Status: DC | PRN
Start: 2012-03-12 — End: 2012-04-16

## 2012-03-12 NOTE — Telephone Encounter (Signed)
TC to pt. LMP today.  States having increased cramping, more than usual.  Bleeding changing pad q 2 h.  No relief with Ibuprofen. States Tramadol has worked in the past. Per DR AVS,, Rx for Tramadol E-scribed.  Pt informed. To call if no improvement or bleeding a pad or more an hour. Pt verbalizes comprehension.

## 2012-04-05 ENCOUNTER — Telehealth: Payer: Self-pay | Admitting: Obstetrics and Gynecology

## 2012-04-05 NOTE — Telephone Encounter (Signed)
Pt called, states had spoken w/ [employer] regarding FMLA and is having severe pelvic pain and needs to see [facility] regarding pain and FMLA.  Pt scheduled an appt w/ [facility] on Monday 04/16/12 @ 1615 for eval.  Pt voices agreement.

## 2012-04-16 ENCOUNTER — Encounter: Payer: Self-pay | Admitting: Obstetrics and Gynecology

## 2012-04-16 ENCOUNTER — Ambulatory Visit (INDEPENDENT_AMBULATORY_CARE_PROVIDER_SITE_OTHER): Payer: Managed Care, Other (non HMO) | Admitting: Obstetrics and Gynecology

## 2012-04-16 VITALS — BP 110/60 | Ht 65.0 in | Wt 208.0 lb

## 2012-04-16 DIAGNOSIS — N949 Unspecified condition associated with female genital organs and menstrual cycle: Secondary | ICD-10-CM

## 2012-04-16 DIAGNOSIS — N809 Endometriosis, unspecified: Secondary | ICD-10-CM

## 2012-04-16 DIAGNOSIS — R102 Pelvic and perineal pain: Secondary | ICD-10-CM

## 2012-04-16 MED ORDER — TRAMADOL HCL 50 MG PO TABS: 50.0000 mg | ORAL_TABLET | Freq: Four times a day (QID) | ORAL | Status: DC | PRN

## 2012-04-16 NOTE — Progress Notes (Signed)
HISTORY OF PRESENT ILLNESS  Ms. Toni Fields is a 27 y.o. year old female,G0P0, who presents for a problem visit. The patient has endometriosis, dysmenorrhea, dyspareunia, interstitial cystitis, and pelvic pain.  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  She does not come to the doctor; she takes tramadol and other pain medications monthly.  Subjective:  Her pain is the same.  She also suffers from migraine headaches.  Objective:  BP 110/60  Ht 5\' 5"  (1.651 m)  Wt 208 lb (94.348 kg)  BMI 34.61 kg/m2  LMP 04/13/2012   General: no distress GI: soft and nontender  External genitalia: normal general appearance Vaginal: normal without tenderness, induration or masses Cervix: nontender Adnexa: normal bimanual exam Uterus: normal size shape and consistency  Assessment:  Endometriosis  Dysmenorrhea  Dyspareunia  Interstitial cystitis  Pelvic pain  Migraine headaches  Plan:  A long discussion was held with the patient about the above diagnosis.  We spoke about FMLA.  We will try to help her with completion of her paperwork.  We are able to document that she has above diagnosis and that pain medication is often required.  We are able to "excuse" her absence from work particularly during her menstrual cycle. We cannot, however, say that we had examined the patient every month and that we are taking her out of work if we have not, in fact, done an examination.  Tramadol 50-100 mg every 6 hours as needed for pain  Return to office in 4 month(s).   Leonard Schwartz M.D.  04/16/2012 6:04 PM

## 2012-06-13 ENCOUNTER — Encounter (HOSPITAL_BASED_OUTPATIENT_CLINIC_OR_DEPARTMENT_OTHER): Payer: Self-pay

## 2012-06-13 ENCOUNTER — Emergency Department (HOSPITAL_BASED_OUTPATIENT_CLINIC_OR_DEPARTMENT_OTHER)
Admission: EM | Admit: 2012-06-13 | Discharge: 2012-06-13 | Disposition: A | Discharge status: Home / Self Care | Payer: Managed Care, Other (non HMO) | Attending: Emergency Medicine | Admitting: Emergency Medicine

## 2012-06-13 DIAGNOSIS — Z8742 Personal history of other diseases of the female genital tract: Secondary | ICD-10-CM | POA: Insufficient documentation

## 2012-06-13 DIAGNOSIS — R51 Headache: Secondary | ICD-10-CM | POA: Insufficient documentation

## 2012-06-13 DIAGNOSIS — G43909 Migraine, unspecified, not intractable, without status migrainosus: Secondary | ICD-10-CM

## 2012-06-13 DIAGNOSIS — Z87448 Personal history of other diseases of urinary system: Secondary | ICD-10-CM | POA: Insufficient documentation

## 2012-06-13 DIAGNOSIS — H53149 Visual discomfort, unspecified: Secondary | ICD-10-CM | POA: Insufficient documentation

## 2012-06-13 DIAGNOSIS — Z79899 Other long term (current) drug therapy: Secondary | ICD-10-CM | POA: Insufficient documentation

## 2012-06-13 DIAGNOSIS — Z8719 Personal history of other diseases of the digestive system: Secondary | ICD-10-CM | POA: Insufficient documentation

## 2012-06-13 DIAGNOSIS — R110 Nausea: Secondary | ICD-10-CM | POA: Insufficient documentation

## 2012-06-13 MED ORDER — HYDROMORPHONE HCL PF 1 MG/ML IJ SOLN
1.0000 mg | Freq: Once | INTRAMUSCULAR | Status: AC
  Administered 2012-06-13: 1 mg via INTRAVENOUS
  Filled 2012-06-13: qty 1

## 2012-06-13 MED ORDER — SODIUM CHLORIDE 0.9 % IV BOLUS (SEPSIS)
1000.0000 mL | Freq: Once | INTRAVENOUS | Status: AC
  Administered 2012-06-13: 1000 mL via INTRAVENOUS

## 2012-06-13 MED ORDER — KETOROLAC TROMETHAMINE 30 MG/ML IJ SOLN
30.0000 mg | Freq: Once | INTRAMUSCULAR | Status: AC
  Administered 2012-06-13: 30 mg via INTRAVENOUS
  Filled 2012-06-13: qty 1

## 2012-06-13 MED ORDER — DEXAMETHASONE SODIUM PHOSPHATE 4 MG/ML IJ SOLN
8.0000 mg | Freq: Once | INTRAMUSCULAR | Status: AC
Start: 2012-06-13 — End: 2012-06-13
  Administered 2012-06-13: 8 mg via INTRAVENOUS
  Filled 2012-06-13: qty 2

## 2012-06-13 MED ORDER — METOCLOPRAMIDE HCL 5 MG/ML IJ SOLN
10.0000 mg | Freq: Once | INTRAMUSCULAR | Status: AC
  Administered 2012-06-13: 10 mg via INTRAVENOUS
  Filled 2012-06-13: qty 2

## 2012-06-13 NOTE — ED Provider Notes (Signed)
History     CSN: [id number]  Arrival date & time 06/13/12  4401   First MD Initiated Contact with Patient 06/13/12 [phone]      Chief Complaint  Patient presents with  . Migraine  . Emesis    (Consider location/radiation/quality/duration/timing/severity/associated sxs/prior treatment) HPI Pt with history of chronic migraine present with identical symptoms to previous migraine starting early this AM. HA is frontal HA with nausea and photophobia. No focal weakness, sensory changes, neck stiffness, fever.  Past Medical History  Diagnosis Date  . Abdominal pain   . IBS (irritable bowel syndrome)   . Hernia, hiatal   . Migraine   . Endometriosis   . IC (interstitial cystitis)     Past Surgical History  Procedure Laterality Date  . Exploratory laparotomy  4/12    endometrosis  . Cystoscopy    . Wisdom tooth extraction    . Nasal endoscopy      Family History  Problem Relation Age of Onset  . Diabetes Paternal Grandfather   . Cancer Paternal Grandmother   . Breast cancer Maternal Grandmother   . Cancer Maternal Grandmother     ovarian  . Diabetes Maternal Grandfather   . Heart disease Father   . Diabetes Mother   . Hypertension Mother   . Hyperlipidemia Mother   . Heart disease Paternal Uncle   . Heart disease Paternal Uncle   . Diabetes Paternal Uncle   . Heart disease Paternal Uncle   . Cancer Paternal Uncle     pancreatic  . Heart disease Paternal Uncle   . Diabetes Paternal Uncle     History  Substance Use Topics  . Smoking status: Never Smoker   . Smokeless tobacco: Never Used  . Alcohol Use: Yes     Comment: weekly    OB History   Grav Para Term Preterm Abortions TAB SAB Ect Mult Living   0               Review of Systems  Constitutional: Negative for fever and chills.  HENT: Negative for neck pain, neck stiffness and sinus pressure.   Eyes: Positive for photophobia.  Respiratory: Negative for shortness of breath.   Cardiovascular: Negative  for chest pain.  Gastrointestinal: Positive for nausea. Negative for vomiting, abdominal pain and diarrhea.  Musculoskeletal: Negative for myalgias and back pain.  Skin: Negative for rash and wound.  Neurological: Positive for headaches. Negative for dizziness, syncope, weakness, light-headedness and numbness.  All other systems reviewed and are negative.    Allergies  Amoxicillin-pot clavulanate and Sulfa antibiotics  Home Medications   Current Outpatient Rx  Name  Route  Sig  Dispense  Refill  . acetaminophen (TYLENOL) 500 MG tablet   Oral   Take 1,000 mg by mouth every 6 (six) hours as needed. For headache          . amitriptyline (ELAVIL) 25 MG tablet   Oral   Take 25 mg by mouth at bedtime.         Marland Kitchen ibuprofen (ADVIL,MOTRIN) 800 MG tablet   Oral   Take 800 mg by mouth every 8 (eight) hours as needed.         Marland Kitchen omeprazole (PRILOSEC) 10 MG capsule   Oral   Take 10 mg by mouth daily as needed. For heartburn         . Prenatal Vit-Fe Fumarate-FA (PRENATAL FORMULA PO)   Oral   Take 1 tablet by mouth daily.           Marland Kitchen  SUMAtriptan (IMITREX) 100 MG tablet   Oral   Take 100 mg by mouth every 2 (two) hours as needed.         . traMADol (ULTRAM) 50 MG tablet   Oral   Take 1 tablet (50 mg total) by mouth every 6 (six) hours as needed for pain. 1-2 PO Q 4-6 HOURS PRN.   40 tablet   1     BP 115/79  Pulse 72  Temp(Src) 97.7 F (36.5 C) (Oral)  Resp 16  Ht 5\' 5"  (1.651 m)  Wt 200 lb (90.719 kg)  BMI 33.28 kg/m2  SpO2 98%  LMP 05/15/2012  Physical Exam  Nursing note and vitals reviewed. Constitutional: She is oriented to person, place, and time. She appears well-developed and well-nourished. No distress.  HENT:  Head: Normocephalic and atraumatic.  Mouth/Throat: Oropharynx is clear and moist.  No sinus tenderness  Eyes: EOM are normal. Pupils are equal, round, and reactive to light.  Neck: Normal range of motion. Neck supple.  No meningismus    Cardiovascular: Normal rate and regular rhythm.   Pulmonary/Chest: Effort normal and breath sounds normal. No respiratory distress. She has no wheezes. She has no rales.  Abdominal: Soft. Bowel sounds are normal.  Musculoskeletal: Normal range of motion. She exhibits no edema and no tenderness.  Neurological: She is alert and oriented to person, place, and time.  5/5 motor in all ext, sensation intact, CN II-XIi intact  Skin: Skin is warm and dry. No rash noted. No erythema.  Psychiatric: She has a normal mood and affect. Her behavior is normal.    ED Course  Procedures (including critical care time)  Labs Reviewed - No data to display No results found.   1. Migraine       MDM  Pt is now symptom-free after meds and requesting d/c home.         Loren Racer, MD 06/13/12 [phone]

## 2012-06-13 NOTE — ED Notes (Signed)
Pt reports a migraine that started Monday associated with vomiting, photophobia and visual changes.

## 2012-09-27 ENCOUNTER — Emergency Department (HOSPITAL_BASED_OUTPATIENT_CLINIC_OR_DEPARTMENT_OTHER)
Admission: EM | Admit: 2012-09-27 | Discharge: 2012-09-27 | Disposition: A | Discharge status: Home / Self Care | Payer: Managed Care, Other (non HMO) | Attending: Emergency Medicine | Admitting: Emergency Medicine

## 2012-09-27 ENCOUNTER — Encounter (HOSPITAL_BASED_OUTPATIENT_CLINIC_OR_DEPARTMENT_OTHER): Payer: Self-pay

## 2012-09-27 DIAGNOSIS — G43909 Migraine, unspecified, not intractable, without status migrainosus: Secondary | ICD-10-CM

## 2012-09-27 DIAGNOSIS — Z91040 Latex allergy status: Secondary | ICD-10-CM | POA: Insufficient documentation

## 2012-09-27 DIAGNOSIS — Z79899 Other long term (current) drug therapy: Secondary | ICD-10-CM | POA: Insufficient documentation

## 2012-09-27 DIAGNOSIS — Z8719 Personal history of other diseases of the digestive system: Secondary | ICD-10-CM | POA: Insufficient documentation

## 2012-09-27 DIAGNOSIS — Z8742 Personal history of other diseases of the female genital tract: Secondary | ICD-10-CM | POA: Insufficient documentation

## 2012-09-27 DIAGNOSIS — R1110 Vomiting, unspecified: Secondary | ICD-10-CM | POA: Insufficient documentation

## 2012-09-27 MED ORDER — KETOROLAC TROMETHAMINE 30 MG/ML IJ SOLN
30.0000 mg | Freq: Once | INTRAMUSCULAR | Status: AC
  Administered 2012-09-27: 30 mg via INTRAVENOUS
  Filled 2012-09-27: qty 1

## 2012-09-27 MED ORDER — DIPHENHYDRAMINE HCL 50 MG/ML IJ SOLN
25.0000 mg | Freq: Once | INTRAMUSCULAR | Status: DC
  Filled 2012-09-27: qty 1

## 2012-09-27 MED ORDER — HYDROMORPHONE HCL PF 1 MG/ML IJ SOLN
1.0000 mg | Freq: Once | INTRAMUSCULAR | Status: AC
  Administered 2012-09-27: 1 mg via INTRAVENOUS
  Filled 2012-09-27: qty 1

## 2012-09-27 MED ORDER — METOCLOPRAMIDE HCL 5 MG/ML IJ SOLN
10.0000 mg | Freq: Once | INTRAMUSCULAR | Status: AC
  Administered 2012-09-27: 10 mg via INTRAVENOUS
  Filled 2012-09-27: qty 2

## 2012-09-27 MED ORDER — DEXAMETHASONE SODIUM PHOSPHATE 10 MG/ML IJ SOLN
10.0000 mg | Freq: Once | INTRAMUSCULAR | Status: AC
  Administered 2012-09-27: 10 mg via INTRAVENOUS
  Filled 2012-09-27: qty 1

## 2012-09-27 NOTE — ED Notes (Signed)
C/o migraine x 3 this week

## 2012-09-27 NOTE — ED Provider Notes (Signed)
History    CSN: [id number] Arrival date & time 09/27/12  2055  First MD Initiated Contact with Patient 09/27/12 2115     Chief Complaint  Patient presents with  . Migraine   (Consider location/radiation/quality/duration/timing/severity/associated sxs/prior Treatment) HPI Comments: Pt has a history of migraine and this is similar:took ultram with minimal relief:vomiting  Patient is a 27 y.o. female presenting with migraines. The history is provided by the patient. No language interpreter was used.  Migraine This is a recurrent problem. The current episode started today. The problem occurs constantly. The problem has been unchanged. Associated symptoms include vomiting. Pertinent negatives include no fever. She has tried nothing for the symptoms.   Past Medical History  Diagnosis Date  . Abdominal pain   . IBS (irritable bowel syndrome)   . Hernia, hiatal   . Migraine   . Endometriosis   . IC (interstitial cystitis)    Past Surgical History  Procedure Laterality Date  . Exploratory laparotomy  4/12    endometrosis  . Cystoscopy    . Wisdom tooth extraction    . Nasal endoscopy     Family History  Problem Relation Age of Onset  . Diabetes Paternal Grandfather   . Cancer Paternal Grandmother   . Breast cancer Maternal Grandmother   . Cancer Maternal Grandmother     ovarian  . Diabetes Maternal Grandfather   . Heart disease Father   . Diabetes Mother   . Hypertension Mother   . Hyperlipidemia Mother   . Heart disease Paternal Uncle   . Heart disease Paternal Uncle   . Diabetes Paternal Uncle   . Heart disease Paternal Uncle   . Cancer Paternal Uncle     pancreatic  . Heart disease Paternal Uncle   . Diabetes Paternal Uncle    History  Substance Use Topics  . Smoking status: Never Smoker   . Smokeless tobacco: Never Used  . Alcohol Use: Yes   OB History   Grav Para Term Preterm Abortions TAB SAB Ect Mult Living   0              Review of Systems   Constitutional: Negative for fever.  Respiratory: Negative.   Cardiovascular: Negative.   Gastrointestinal: Positive for vomiting.    Allergies  Amoxicillin-pot clavulanate; Sulfa antibiotics; and Latex  Home Medications   Current Outpatient Rx  Name  Route  Sig  Dispense  Refill  . acetaminophen (TYLENOL) 500 MG tablet   Oral   Take 1,000 mg by mouth every 6 (six) hours as needed. For headache          . amitriptyline (ELAVIL) 25 MG tablet   Oral   Take 25 mg by mouth at bedtime.         Marland Kitchen ibuprofen (ADVIL,MOTRIN) 800 MG tablet   Oral   Take 800 mg by mouth every 8 (eight) hours as needed.         Marland Kitchen omeprazole (PRILOSEC) 10 MG capsule   Oral   Take 10 mg by mouth daily as needed. For heartburn         . Prenatal Vit-Fe Fumarate-FA (PRENATAL FORMULA PO)   Oral   Take 1 tablet by mouth daily.           . SUMAtriptan (IMITREX) 100 MG tablet   Oral   Take 100 mg by mouth every 2 (two) hours as needed.         . traMADol (ULTRAM) 50 MG tablet  Oral   Take 1 tablet (50 mg total) by mouth every 6 (six) hours as needed for pain. 1-2 PO Q 4-6 HOURS PRN.   40 tablet   1    BP 119/73  Pulse 72  Temp(Src) 98.6 F (37 C) (Oral)  Resp 16  Ht 5\' 5"  (1.651 m)  Wt 210 lb (95.255 kg)  BMI 34.95 kg/m2  SpO2 100%  LMP 09/25/2012 Physical Exam  Nursing note and vitals reviewed. Constitutional: She is oriented to person, place, and time. She appears well-developed and well-nourished.  HENT:  Right Ear: External ear normal.  Left Ear: External ear normal.  Eyes: Conjunctivae and EOM are normal. Pupils are equal, round, and reactive to light.  Neck: Normal range of motion. Neck supple.  Cardiovascular: Normal rate and regular rhythm.   Pulmonary/Chest: Effort normal and breath sounds normal.  Abdominal: Soft. Bowel sounds are normal.  Musculoskeletal: Normal range of motion.  Neurological: She is alert and oriented to person, place, and time.  Skin: Skin is  warm and dry.  Psychiatric: She has a normal mood and affect.    ED Course  Procedures (including critical care time) Labs Reviewed - No data to display No results found. 1. Migraine     MDM  Pt left under care of Dr. Carlean Jews, NP 09/28/12 1930

## 2012-09-27 NOTE — ED Notes (Signed)
NP at bedside.

## 2012-09-29 NOTE — ED Provider Notes (Signed)
Medical screening examination/treatment/procedure(s) were performed by non-physician practitioner and as supervising physician I was immediately available for consultation/collaboration.   Gwyneth Sprout, MD 09/29/12 2314

## 2013-01-07 ENCOUNTER — Encounter: Payer: Self-pay | Admitting: Obstetrics and Gynecology

## 2013-01-31 ENCOUNTER — Ambulatory Visit: Payer: Managed Care, Other (non HMO) | Admitting: Psychology

## 2014-04-04 HISTORY — PX: CHOLECYSTECTOMY: SHX55

## 2017-04-04 HISTORY — PX: ERCP: SHX60

## 2017-10-04 ENCOUNTER — Other Ambulatory Visit: Payer: Self-pay

## 2017-10-04 ENCOUNTER — Emergency Department (HOSPITAL_BASED_OUTPATIENT_CLINIC_OR_DEPARTMENT_OTHER)
Admission: EM | Admit: 2017-10-04 | Discharge: 2017-10-05 | Disposition: A | Discharge status: Home / Self Care | Payer: BC Managed Care – PPO | Attending: Emergency Medicine | Admitting: Emergency Medicine

## 2017-10-04 ENCOUNTER — Encounter (HOSPITAL_BASED_OUTPATIENT_CLINIC_OR_DEPARTMENT_OTHER): Payer: Self-pay | Admitting: *Deleted

## 2017-10-04 ENCOUNTER — Emergency Department (HOSPITAL_BASED_OUTPATIENT_CLINIC_OR_DEPARTMENT_OTHER): Payer: BC Managed Care – PPO

## 2017-10-04 DIAGNOSIS — R071 Chest pain on breathing: Secondary | ICD-10-CM | POA: Insufficient documentation

## 2017-10-04 DIAGNOSIS — Z91040 Latex allergy status: Secondary | ICD-10-CM | POA: Insufficient documentation

## 2017-10-04 DIAGNOSIS — R112 Nausea with vomiting, unspecified: Secondary | ICD-10-CM | POA: Insufficient documentation

## 2017-10-04 DIAGNOSIS — R072 Precordial pain: Secondary | ICD-10-CM | POA: Diagnosis not present

## 2017-10-04 DIAGNOSIS — R1013 Epigastric pain: Secondary | ICD-10-CM | POA: Diagnosis present

## 2017-10-04 HISTORY — DX: Biliary acute pancreatitis without necrosis or infection: K85.10

## 2017-10-04 LAB — URINALYSIS, ROUTINE W REFLEX MICROSCOPIC
BILIRUBIN URINE: NEGATIVE
Glucose, UA: NEGATIVE mg/dL
KETONES UR: NEGATIVE mg/dL
Leukocytes, UA: NEGATIVE
Nitrite: NEGATIVE
PH: 6 (ref 5.0–8.0)
Protein, ur: NEGATIVE mg/dL
Specific Gravity, Urine: 1.025 (ref 1.005–1.030)

## 2017-10-04 LAB — COMPREHENSIVE METABOLIC PANEL
ALBUMIN: 3.5 g/dL (ref 3.5–5.0)
ALT: 12 U/L (ref 0–44)
AST: 12 U/L — AB (ref 15–41)
Alkaline Phosphatase: 62 U/L (ref 38–126)
Anion gap: 6 (ref 5–15)
BUN: 11 mg/dL (ref 6–20)
CHLORIDE: 108 mmol/L (ref 98–111)
CO2: 24 mmol/L (ref 22–32)
CREATININE: 0.73 mg/dL (ref 0.44–1.00)
Calcium: 8.7 mg/dL — ABNORMAL LOW (ref 8.9–10.3)
GFR calc Af Amer: >60 mL/min (ref >60)
GFR calc non Af Amer: >60 mL/min (ref >60)
Glucose, Bld: 109 mg/dL — ABNORMAL HIGH (ref 70–99)
Potassium: 3.5 mmol/L (ref 3.5–5.1)
SODIUM: 138 mmol/L (ref 135–145)
Total Bilirubin: 0.3 mg/dL (ref 0.3–1.2)
Total Protein: 6.2 g/dL — ABNORMAL LOW (ref 6.5–8.1)

## 2017-10-04 LAB — PREGNANCY, URINE: Preg Test, Ur: NEGATIVE

## 2017-10-04 LAB — URINALYSIS, MICROSCOPIC (REFLEX): WBC UA: NONE SEEN WBC/hpf (ref 0–5)

## 2017-10-04 LAB — CBC WITH DIFFERENTIAL/PLATELET
Basophils Absolute: 0 10*3/uL (ref 0.0–0.1)
Basophils Relative: 0 %
EOS PCT: 1 %
Eosinophils Absolute: 0.1 10*3/uL (ref 0.0–0.7)
HEMATOCRIT: 39.1 % (ref 36.0–46.0)
Hemoglobin: 13.8 g/dL (ref 12.0–15.0)
LYMPHS ABS: 2.8 10*3/uL (ref 0.7–4.0)
LYMPHS PCT: 26 %
MCH: 31.7 pg (ref 26.0–34.0)
MCHC: 35.3 g/dL (ref 30.0–36.0)
MCV: 89.9 fL (ref 78.0–100.0)
MONO ABS: 1.3 10*3/uL — AB (ref 0.1–1.0)
Monocytes Relative: 12 %
Neutro Abs: 6.4 10*3/uL (ref 1.7–7.7)
Neutrophils Relative %: 61 %
PLATELETS: 256 10*3/uL (ref 150–400)
RBC: 4.35 MIL/uL (ref 3.87–5.11)
RDW: 12.7 % (ref 11.5–15.5)
WBC: 10.6 10*3/uL — ABNORMAL HIGH (ref 4.0–10.5)

## 2017-10-04 LAB — LIPASE, BLOOD: Lipase: 25 U/L (ref 11–51)

## 2017-10-04 LAB — TROPONIN I: Troponin I: <0.03 ng/mL (ref <0.03)

## 2017-10-04 LAB — D-DIMER, QUANTITATIVE: D-Dimer, Quant: <0.27 ug/mL-FEU (ref 0.00–0.50)

## 2017-10-04 MED ORDER — GI COCKTAIL ~~LOC~~
30.0000 mL | Freq: Once | ORAL | Status: AC
  Administered 2017-10-04: 30 mL via ORAL
  Filled 2017-10-04: qty 30

## 2017-10-04 MED ORDER — KETOROLAC TROMETHAMINE 30 MG/ML IJ SOLN
30.0000 mg | Freq: Once | INTRAMUSCULAR | Status: AC
  Administered 2017-10-04: 30 mg via INTRAVENOUS
  Filled 2017-10-04: qty 1

## 2017-10-04 NOTE — ED Provider Notes (Signed)
Emergency Department Provider Note   I have reviewed the triage vital signs and the nursing notes.   HISTORY  Chief Complaint Abdominal Pain   HPI Toni Fields is a 32 y.o. female with PMH of biliary pancreatitis s/p cholecystectomy presents to the emergency department for evaluation of epigastric abdominal discomfort radiating to the left breast, chest, and back.  Symptoms began 2 days ago and have become more severe.  The patient did take Prilosec yesterday with some mild, temporary relief in symptoms.  She has had nausea and vomiting but denies any blood or coffee-ground material in the emesis.  No diarrhea.  Denies any blood in the stool or black bowel movements.  She states the pain is worse with deep breathing and has no history of DVT/PE.  No other modifying factors.  Past Medical History:  Diagnosis Date  . Abdominal pain   . Acute biliary pancreatitis   . Endometriosis   . Hernia, hiatal   . IBS (irritable bowel syndrome)   . IC (interstitial cystitis)   . Migraine     Patient Active Problem List   Diagnosis Date Noted  . Dysmenorrhea 08/02/2011  . Endometriosis 08/02/2011  . Infertility, female 08/02/2011  . Hidradenitis 08/02/2011  . Obesity 08/02/2011    Past Surgical History:  Procedure Laterality Date  . CHOLECYSTECTOMY    . CYSTOSCOPY    . EXPLORATORY LAPAROTOMY  4/12   endometrosis  . NASAL ENDOSCOPY    . WISDOM TOOTH EXTRACTION      Allergies Amoxicillin-pot clavulanate; Sulfa antibiotics; and Latex  Family History  Problem Relation Age of Onset  . Diabetes Paternal Grandfather   . Breast cancer Maternal Grandmother   . Cancer Maternal Grandmother        ovarian  . Diabetes Maternal Grandfather   . Heart disease Father   . Diabetes Mother   . Hypertension Mother   . Hyperlipidemia Mother   . Heart disease Paternal Uncle   . Heart disease Paternal Uncle   . Diabetes Paternal Uncle   . Heart disease Paternal Uncle   . Cancer Paternal  Uncle        pancreatic  . Heart disease Paternal Uncle   . Diabetes Paternal Uncle   . Cancer Paternal Grandmother     Social History Social History   Tobacco Use  . Smoking status: Never Smoker  . Smokeless tobacco: Never Used  Substance Use Topics  . Alcohol use: Yes  . Drug use: No    Review of Systems  Constitutional: No fever/chills Eyes: No visual changes. ENT: No sore throat. Cardiovascular: Positive chest pain. Respiratory: Denies shortness of breath. Gastrointestinal: Positive epigastric abdominal pain. Positive nausea and vomiting.  No diarrhea.  No constipation. Genitourinary: Negative for dysuria. Musculoskeletal: Negative for back pain. Skin: Negative for rash. Neurological: Negative for headaches, focal weakness or numbness.  10-point ROS otherwise negative.  ____________________________________________   PHYSICAL EXAM:  VITAL SIGNS: ED Triage Vitals  Enc Vitals Group     BP 10/04/17 2308 109/77     Pulse Rate 10/04/17 2308 60     Resp 10/04/17 2308 16     Temp 10/04/17 2308 98.2 F (36.8 C)     Temp Source 10/04/17 2308 Oral     SpO2 10/04/17 2308 100 %     Weight 10/04/17 2306 185 lb (83.9 kg)     Height 10/04/17 2306 5\' 6"  (1.676 m)     Pain Score 10/04/17 2306 9   Constitutional:  Alert and oriented. Well appearing and in no acute distress. Eyes: Conjunctivae are normal. Head: Atraumatic. Nose: No congestion/rhinnorhea. Mouth/Throat: Mucous membranes are moist.  Neck: No stridor.  Cardiovascular: Normal rate, regular rhythm. Good peripheral circulation. Grossly normal heart sounds.   Respiratory: Normal respiratory effort.  No retractions. Lungs CTAB. Gastrointestinal: Soft with epigastric abdominal tenderness. No rebound or guarding. No lower abdominal discomfort. No distention.  Musculoskeletal: No lower extremity tenderness nor edema. No gross deformities of extremities. Neurologic:  Normal speech and language. No gross focal  neurologic deficits are appreciated.  Skin:  Skin is warm, dry and intact. No rash noted.  ____________________________________________   LABS (all labs ordered are listed, but only abnormal results are displayed)  Labs Reviewed  URINALYSIS, ROUTINE W REFLEX MICROSCOPIC - Abnormal; Notable for the following components:      Result Value   APPearance CLOUDY (*)    Hgb urine dipstick TRACE (*)    All other components within normal limits  COMPREHENSIVE METABOLIC PANEL - Abnormal; Notable for the following components:   Glucose, Bld 109 (*)    Calcium 8.7 (*)    Total Protein 6.2 (*)    AST 12 (*)    All other components within normal limits  CBC WITH DIFFERENTIAL/PLATELET - Abnormal; Notable for the following components:   WBC 10.6 (*)    Monocytes Absolute 1.3 (*)    All other components within normal limits  URINALYSIS, MICROSCOPIC (REFLEX) - Abnormal; Notable for the following components:   Bacteria, UA RARE (*)    All other components within normal limits  PREGNANCY, URINE  LIPASE, BLOOD  TROPONIN I  D-DIMER, QUANTITATIVE (NOT AT [facility])   ____________________________________________  RADIOLOGY  Dg Chest 2 View  Result Date: 10/04/2017 CLINICAL DATA:  Chest pain for 2 days. EXAM: CHEST - 2 VIEW COMPARISON:  None. FINDINGS: The cardiomediastinal silhouette is unremarkable. There is no evidence of focal airspace disease, pulmonary edema, suspicious pulmonary nodule/mass, pleural effusion, or pneumothorax. No acute bony abnormalities are identified. IMPRESSION: No active cardiopulmonary disease. Electronically Signed   By: Margarette Canada M.D.   On: 10/04/2017 23:49    ____________________________________________   PROCEDURES  Procedure(s) performed:   Procedures  None ____________________________________________   INITIAL IMPRESSION / ASSESSMENT AND PLAN / ED COURSE  Pertinent labs & imaging results that were available during my care of the patient were reviewed by me  and considered in my medical decision making (see chart for details).  Patient presents to the emergency department for evaluation of epigastric abdominal pain radiating to the chest.  Some component of her pain is described as pleuritic.  Lower suspicion for PE/ACS but will obtain d-dimer, troponin, EKG.  Plan for Toradol and GI cocktail as I suspect this is esophagitis related.  No clinical concern for perforated ulcer.   Patient feeling better after GI cocktail. EKG and labs with no acute findings. CXR unremarkable. Plan for PPI at home and PCP follow up. May need referral to GI for EGD if symptoms persist. Will discuss with PCP further.   At this time, I do not feel there is any life-threatening condition present. I have reviewed and discussed all results (EKG, imaging, lab, urine as appropriate), exam findings with patient. I have reviewed nursing notes and appropriate previous records.  I feel the patient is safe to be discharged home without further emergent workup. Discussed usual and customary return precautions. Patient and family (if present) verbalize understanding and are comfortable with this plan.  Patient will  follow-up with their primary care provider. If they do not have a primary care provider, information for follow-up has been provided to them. All questions have been answered.  ____________________________________________  FINAL CLINICAL IMPRESSION(S) / ED DIAGNOSES  Final diagnoses:  Epigastric pain  Precordial chest pain     MEDICATIONS GIVEN DURING THIS VISIT:  Medications  ketorolac (TORADOL) 30 MG/ML injection 30 mg (30 mg Intravenous Given 10/04/17 2328)  gi cocktail (Maalox,Lidocaine,Donnatal) (30 mLs Oral Given 10/04/17 2327)     NEW OUTPATIENT MEDICATIONS STARTED DURING THIS VISIT:  Discharge Medication List as of 10/05/2017 12:18 AM    START taking these medications   Details  pantoprazole (PROTONIX) 20 MG tablet Take 1 tablet (20 mg total) by mouth daily.,  Starting Thu 10/05/2017, Until Sat 11/04/2017, Print    sucralfate (CARAFATE) 1 GM/10ML suspension Take 10 mLs (1 g total) by mouth 4 (four) times daily -  with meals and at bedtime., Starting Thu 10/05/2017, Print        Note:  This document was prepared using Dragon voice recognition software and may include unintentional dictation errors.  Nanda Quinton, MD Emergency Medicine    Mikle Sternberg, [facility], MD 10/05/17 [phone]

## 2017-10-04 NOTE — ED Triage Notes (Addendum)
Pt c/o epigastric abd pain , n/v x 2 days, pain relieved for 8 hrs  by Prilosec

## 2017-10-04 NOTE — ED Notes (Signed)
Patient transported to X-ray 

## 2017-10-05 MED ORDER — SUCRALFATE 1 GM/10ML PO SUSP: 1.0000 g | Freq: Three times a day (TID) | ORAL | 0 refills | Status: DC

## 2017-10-05 MED ORDER — PANTOPRAZOLE SODIUM 20 MG PO TBEC: 20.0000 mg | DELAYED_RELEASE_TABLET | Freq: Every day | ORAL | 0 refills | Status: DC

## 2017-10-05 MED ORDER — IBUPROFEN 800 MG PO TABS: 800.0000 mg | ORAL_TABLET | Freq: Three times a day (TID) | ORAL | 0 refills | Status: DC | PRN

## 2017-10-05 NOTE — Discharge Instructions (Signed)

## 2017-10-05 NOTE — ED Notes (Signed)
Pt verbalizes understanding of d/c instructions and denies any further needs at this time. 

## 2017-10-06 ENCOUNTER — Other Ambulatory Visit: Payer: Self-pay

## 2017-10-06 ENCOUNTER — Emergency Department (HOSPITAL_BASED_OUTPATIENT_CLINIC_OR_DEPARTMENT_OTHER)
Admission: EM | Admit: 2017-10-06 | Discharge: 2017-10-06 | Disposition: A | Discharge status: Home / Self Care | Payer: BC Managed Care – PPO | Attending: Emergency Medicine | Admitting: Emergency Medicine

## 2017-10-06 ENCOUNTER — Emergency Department (HOSPITAL_BASED_OUTPATIENT_CLINIC_OR_DEPARTMENT_OTHER): Payer: BC Managed Care – PPO

## 2017-10-06 ENCOUNTER — Encounter (HOSPITAL_BASED_OUTPATIENT_CLINIC_OR_DEPARTMENT_OTHER): Payer: Self-pay | Admitting: Emergency Medicine

## 2017-10-06 DIAGNOSIS — R1013 Epigastric pain: Secondary | ICD-10-CM

## 2017-10-06 DIAGNOSIS — Z3202 Encounter for pregnancy test, result negative: Secondary | ICD-10-CM | POA: Insufficient documentation

## 2017-10-06 DIAGNOSIS — K861 Other chronic pancreatitis: Secondary | ICD-10-CM | POA: Diagnosis not present

## 2017-10-06 DIAGNOSIS — Z91040 Latex allergy status: Secondary | ICD-10-CM | POA: Insufficient documentation

## 2017-10-06 LAB — URINALYSIS, ROUTINE W REFLEX MICROSCOPIC
Bilirubin Urine: NEGATIVE
GLUCOSE, UA: NEGATIVE mg/dL
Ketones, ur: NEGATIVE mg/dL
LEUKOCYTES UA: NEGATIVE
Nitrite: NEGATIVE
PH: 6 (ref 5.0–8.0)
PROTEIN: NEGATIVE mg/dL
Specific Gravity, Urine: 1.02 (ref 1.005–1.030)

## 2017-10-06 LAB — CBC WITH DIFFERENTIAL/PLATELET
Basophils Absolute: 0 10*3/uL (ref 0.0–0.1)
Basophils Relative: 0 %
Eosinophils Absolute: 0.1 10*3/uL (ref 0.0–0.7)
Eosinophils Relative: 1 %
HEMATOCRIT: 38.6 % (ref 36.0–46.0)
HEMOGLOBIN: 13.7 g/dL (ref 12.0–15.0)
LYMPHS ABS: 4.4 10*3/uL — AB (ref 0.7–4.0)
Lymphocytes Relative: 31 %
MCH: 31.8 pg (ref 26.0–34.0)
MCHC: 35.5 g/dL (ref 30.0–36.0)
MCV: 89.6 fL (ref 78.0–100.0)
Monocytes Absolute: 1.6 10*3/uL — ABNORMAL HIGH (ref 0.1–1.0)
Monocytes Relative: 11 %
NEUTROS ABS: 7.9 10*3/uL — AB (ref 1.7–7.7)
NEUTROS PCT: 57 %
Platelets: 271 10*3/uL (ref 150–400)
RBC: 4.31 MIL/uL (ref 3.87–5.11)
RDW: 12.7 % (ref 11.5–15.5)
WBC: 14 10*3/uL — AB (ref 4.0–10.5)

## 2017-10-06 LAB — URINALYSIS, MICROSCOPIC (REFLEX)

## 2017-10-06 LAB — COMPREHENSIVE METABOLIC PANEL
ALBUMIN: 3.8 g/dL (ref 3.5–5.0)
ALK PHOS: 64 U/L (ref 38–126)
ALT: 11 U/L (ref 0–44)
ANION GAP: 8 (ref 5–15)
AST: 18 U/L (ref 15–41)
BILIRUBIN TOTAL: 0.1 mg/dL — AB (ref 0.3–1.2)
BUN: 13 mg/dL (ref 6–20)
CALCIUM: 8.7 mg/dL — AB (ref 8.9–10.3)
CO2: 23 mmol/L (ref 22–32)
CREATININE: 0.86 mg/dL (ref 0.44–1.00)
Chloride: 104 mmol/L (ref 98–111)
GFR calc Af Amer: >60 mL/min (ref >60)
GFR calc non Af Amer: >60 mL/min (ref >60)
GLUCOSE: 106 mg/dL — AB (ref 70–99)
Potassium: 3.5 mmol/L (ref 3.5–5.1)
Sodium: 135 mmol/L (ref 135–145)
TOTAL PROTEIN: 6.8 g/dL (ref 6.5–8.1)

## 2017-10-06 LAB — I-STAT CG4 LACTIC ACID, ED
LACTIC ACID, VENOUS: 0.92 mmol/L (ref 0.5–1.9)
Lactic Acid, Venous: 2.6 mmol/L (ref 0.5–1.9)

## 2017-10-06 LAB — LIPASE, BLOOD: Lipase: 28 U/L (ref 11–51)

## 2017-10-06 LAB — PREGNANCY, URINE: Preg Test, Ur: NEGATIVE

## 2017-10-06 MED ORDER — ONDANSETRON HCL 4 MG/2ML IJ SOLN
4.0000 mg | Freq: Once | INTRAMUSCULAR | Status: AC
  Administered 2017-10-06: 4 mg via INTRAVENOUS
  Filled 2017-10-06: qty 2

## 2017-10-06 MED ORDER — HYDROMORPHONE HCL 1 MG/ML IJ SOLN
1.0000 mg | Freq: Once | INTRAMUSCULAR | Status: AC
  Administered 2017-10-06: 1 mg via INTRAVENOUS
  Filled 2017-10-06: qty 1

## 2017-10-06 MED ORDER — HYDROCODONE-ACETAMINOPHEN 5-325 MG PO TABS
2.0000 | ORAL_TABLET | Freq: Once | ORAL | Status: AC
  Administered 2017-10-06: 2 via ORAL
  Filled 2017-10-06: qty 2

## 2017-10-06 MED ORDER — METOCLOPRAMIDE HCL 5 MG/ML IJ SOLN
10.0000 mg | Freq: Once | INTRAMUSCULAR | Status: AC
  Administered 2017-10-06: 10 mg via INTRAVENOUS
  Filled 2017-10-06: qty 2

## 2017-10-06 MED ORDER — GI COCKTAIL ~~LOC~~
30.0000 mL | Freq: Once | ORAL | Status: AC
  Administered 2017-10-06: 30 mL via ORAL
  Filled 2017-10-06: qty 30

## 2017-10-06 MED ORDER — DICYCLOMINE HCL 20 MG PO TABS: 20.0000 mg | ORAL_TABLET | Freq: Three times a day (TID) | ORAL | 0 refills | Status: DC

## 2017-10-06 MED ORDER — SODIUM CHLORIDE 0.9 % IV BOLUS
1000.0000 mL | Freq: Once | INTRAVENOUS | Status: AC
  Administered 2017-10-06: 1000 mL via INTRAVENOUS

## 2017-10-06 MED ORDER — ONDANSETRON 4 MG PO TBDP: 4.0000 mg | ORAL_TABLET | Freq: Three times a day (TID) | ORAL | 0 refills | Status: DC | PRN

## 2017-10-06 MED ORDER — DIPHENHYDRAMINE HCL 50 MG/ML IJ SOLN
25.0000 mg | Freq: Once | INTRAMUSCULAR | Status: AC
  Administered 2017-10-06: 25 mg via INTRAVENOUS
  Filled 2017-10-06: qty 1

## 2017-10-06 MED ORDER — IOPAMIDOL (ISOVUE-300) INJECTION 61%
100.0000 mL | Freq: Once | INTRAVENOUS | Status: AC | PRN
  Administered 2017-10-06: 100 mL via INTRAVENOUS

## 2017-10-06 MED ORDER — HYDROCODONE-ACETAMINOPHEN 5-325 MG PO TABS: 1.0000 | ORAL_TABLET | Freq: Four times a day (QID) | ORAL | 0 refills | Status: DC | PRN

## 2017-10-06 NOTE — ED Provider Notes (Signed)
[facility] EMERGENCY DEPARTMENT Provider Note   CSN: [id number] Arrival date & time: 10/06/17  0031     History   Chief Complaint Chief Complaint  Patient presents with  . Abdominal Pain    HPI Toni Fields is a 32 y.o. female.  HPI   32 year old female with history of previous gallstones with biliary pancreatitis, IBS, interstitial cystitis, here with severe abdominal pain.  The patient was just seen on 7/2.  She reports gradual onset of progressively worsening epigastric pain.  Pain is aching, severe, but intermittently sharp and stabbing.  It radiates towards her back then down her bilateral sides.  She states the pain feels similar to her previous episode of pancreatitis which is when she had her gallbladder out.  She denies any alcohol use.  She was seen on 7/2 and had reassuring labs with some mild improvement after GI cocktail.  She was sent home.  She states the pain never really improved and has since worsened.  She does admit that she tried a red hot Chito prior to the worsening of her pain today.  She has not been taking the Carafate that was prescribed.  Denies any alleviating factors.  Pain is worse with any attempted eating.  No fevers.  No urinary symptoms.  No vaginal bleeding or discharge.  Past Medical History:  Diagnosis Date  . Abdominal pain   . Acute biliary pancreatitis   . Endometriosis   . Hernia, hiatal   . IBS (irritable bowel syndrome)   . IC (interstitial cystitis)   . Migraine     Patient Active Problem List   Diagnosis Date Noted  . Dysmenorrhea 08/02/2011  . Endometriosis 08/02/2011  . Infertility, female 08/02/2011  . Hidradenitis 08/02/2011  . Obesity 08/02/2011    Past Surgical History:  Procedure Laterality Date  . CHOLECYSTECTOMY    . CYSTOSCOPY    . EXPLORATORY LAPAROTOMY  4/12   endometrosis  . NASAL ENDOSCOPY    . WISDOM TOOTH EXTRACTION       OB History    Gravida  0   Para      Term      Preterm      AB      Living        SAB      TAB      Ectopic      Multiple      Live Births               Home Medications    Prior to Admission medications   Medication Sig Start Date End Date Taking? Authorizing Provider  acetaminophen (TYLENOL) 500 MG tablet Take 1,000 mg by mouth every 6 (six) hours as needed. For headache     [provider]  dicyclomine (BENTYL) 20 MG tablet Take 1 tablet (20 mg total) by mouth 4 (four) times daily -  before meals and at bedtime for 7 days. 10/06/17 10/13/17  Duffy Bruce, MD  HYDROcodone-acetaminophen (NORCO/VICODIN) 5-325 MG tablet Take 1-2 tablets by mouth every 6 (six) hours as needed for moderate pain or severe pain. 10/06/17   Duffy Bruce, MD  ibuprofen (ADVIL,MOTRIN) 800 MG tablet Take 1 tablet (800 mg total) by mouth every 8 (eight) hours as needed. 10/05/17   Long, Wonda Olds, MD  omeprazole (PRILOSEC) 10 MG capsule Take 10 mg by mouth daily as needed. For heartburn    [provider]  ondansetron (ZOFRAN ODT) 4 MG disintegrating tablet Take 1  tablet (4 mg total) by mouth every 8 (eight) hours as needed for nausea or vomiting. 10/06/17   Duffy Bruce, MD  pantoprazole (PROTONIX) 20 MG tablet Take 1 tablet (20 mg total) by mouth daily. 10/05/17 11/04/17  Long, Wonda Olds, MD  sucralfate (CARAFATE) 1 GM/10ML suspension Take 10 mLs (1 g total) by mouth 4 (four) times daily -  with meals and at bedtime. 10/05/17   Long, Wonda Olds, MD  traMADol (ULTRAM) 50 MG tablet Take 1 tablet (50 mg total) by mouth every 6 (six) hours as needed for pain. 1-2 PO Q 4-6 HOURS PRN. 04/16/12   Ena Dawley, MD    Family History Family History  Problem Relation Age of Onset  . Diabetes Paternal Grandfather   . Breast cancer Maternal Grandmother   . Cancer Maternal Grandmother        ovarian  . Diabetes Maternal Grandfather   . Heart disease Father   . Diabetes Mother   . Hypertension Mother   . Hyperlipidemia Mother   . Heart disease Paternal  Uncle   . Heart disease Paternal Uncle   . Diabetes Paternal Uncle   . Heart disease Paternal Uncle   . Cancer Paternal Uncle        pancreatic  . Heart disease Paternal Uncle   . Diabetes Paternal Uncle   . Cancer Paternal Grandmother     Social History Social History   Tobacco Use  . Smoking status: Never Smoker  . Smokeless tobacco: Never Used  Substance Use Topics  . Alcohol use: Not Currently  . Drug use: No     Allergies   Amoxicillin-pot clavulanate; Sulfa antibiotics; and Latex   Review of Systems Review of Systems  Constitutional: Positive for fatigue. Negative for chills and fever.  HENT: Negative for congestion and rhinorrhea.   Eyes: Negative for visual disturbance.  Respiratory: Negative for cough, shortness of breath and wheezing.   Cardiovascular: Negative for chest pain and leg swelling.  Gastrointestinal: Positive for abdominal pain, constipation, nausea and vomiting. Negative for diarrhea.  Genitourinary: Negative for dysuria and flank pain.  Musculoskeletal: Negative for neck pain and neck stiffness.  Skin: Negative for rash and wound.  Allergic/Immunologic: Negative for immunocompromised state.  Neurological: Positive for weakness. Negative for syncope and headaches.  All other systems reviewed and are negative.    Physical Exam Updated Vital Signs BP 104/68 (BP Location: Right Arm)   Pulse 65   Temp 97.6 F (36.4 C) (Oral)   Resp 16   Ht 5\' 6"  (1.676 m)   Wt 83.9 kg (185 lb)   LMP 09/19/2017   SpO2 96%   BMI 29.86 kg/m   Physical Exam  Constitutional: She is oriented to person, place, and time. She appears well-developed and well-nourished. She appears distressed (Tearful, and in pain).  HENT:  Head: Normocephalic and atraumatic.  Eyes: Conjunctivae are normal.  Neck: Neck supple.  Cardiovascular: Normal rate, regular rhythm and normal heart sounds. Exam reveals no friction rub.  No murmur heard. Pulmonary/Chest: Effort normal and  breath sounds normal. No respiratory distress. She has no wheezes. She has no rales.  Abdominal: Normal appearance and bowel sounds are normal. She exhibits no distension. There is tenderness in the right upper quadrant, epigastric area, periumbilical area and left upper quadrant. There is guarding. There is no rigidity, no rebound, no tenderness at McBurney's point and negative Murphy's sign.  Musculoskeletal: She exhibits no edema.  Neurological: She is alert and oriented to person, place,  and time. She exhibits normal muscle tone.  Skin: Skin is warm. Capillary refill takes less than 2 seconds.  Psychiatric: She has a normal mood and affect.  Nursing note and vitals reviewed.    ED Treatments / Results  Labs (all labs ordered are listed, but only abnormal results are displayed) Labs Reviewed  CBC WITH DIFFERENTIAL/PLATELET - Abnormal; Notable for the following components:      Result Value   WBC 14.0 (*)    Neutro Abs 7.9 (*)    Lymphs Abs 4.4 (*)    Monocytes Absolute 1.6 (*)    All other components within normal limits  COMPREHENSIVE METABOLIC PANEL - Abnormal; Notable for the following components:   Glucose, Bld 106 (*)    Calcium 8.7 (*)    Total Bilirubin 0.1 (*)    All other components within normal limits  URINALYSIS, ROUTINE W REFLEX MICROSCOPIC - Abnormal; Notable for the following components:   Hgb urine dipstick TRACE (*)    All other components within normal limits  URINALYSIS, MICROSCOPIC (REFLEX) - Abnormal; Notable for the following components:   Bacteria, UA FEW (*)    All other components within normal limits  I-STAT CG4 LACTIC ACID, ED - Abnormal; Notable for the following components:   Lactic Acid, Venous 2.60 (*)    All other components within normal limits  LIPASE, BLOOD  PREGNANCY, URINE  I-STAT CG4 LACTIC ACID, ED    EKG None  Radiology Dg Chest 2 View  Result Date: 10/04/2017 CLINICAL DATA:  Chest pain for 2 days. EXAM: CHEST - 2 VIEW  COMPARISON:  None. FINDINGS: The cardiomediastinal silhouette is unremarkable. There is no evidence of focal airspace disease, pulmonary edema, suspicious pulmonary nodule/mass, pleural effusion, or pneumothorax. No acute bony abnormalities are identified. IMPRESSION: No active cardiopulmonary disease. Electronically Signed   By: Margarette Canada M.D.   On: 10/04/2017 23:49   Ct Abdomen Pelvis W Contrast  Result Date: 10/06/2017 CLINICAL DATA:  Abdomen distension with nausea and vomiting, epigastric pain EXAM: CT ABDOMEN AND PELVIS WITH CONTRAST TECHNIQUE: Multidetector CT imaging of the abdomen and pelvis was performed using the standard protocol following bolus administration of intravenous contrast. CONTRAST:  143mL ISOVUE-300 IOPAMIDOL (ISOVUE-300) INJECTION 61% COMPARISON:  CT 09/14/2010 FINDINGS: Lower chest: No acute abnormality. Hepatobiliary: Status post cholecystectomy. Minimal intra hepatic biliary enlargement, likely due to surgical change. No focal hepatic abnormality Pancreas: Unremarkable. No pancreatic ductal dilatation or surrounding inflammatory changes. Spleen: Normal in size without focal abnormality. Adrenals/Urinary Tract: Adrenal glands are unremarkable. Kidneys are normal, without renal calculi, focal lesion, or hydronephrosis. Bladder is unremarkable. Stomach/Bowel: Stomach is within normal limits. Appendix appears normal. No evidence of bowel wall thickening, distention, or inflammatory changes. Vascular/Lymphatic: No significant vascular findings are present. No enlarged abdominal or pelvic lymph nodes. Reproductive: Uterus and bilateral adnexa are unremarkable. Other: No abdominal wall hernia or abnormality. No abdominopelvic ascites. Musculoskeletal: No acute or significant osseous findings. IMPRESSION: Negative. No CT evidence for acute intra-abdominal or pelvic abnormality Electronically Signed   By: Donavan Foil M.D.   On: 10/06/2017 01:49    Procedures Procedures (including  critical care time)  Medications Ordered in ED Medications  sodium chloride 0.9 % bolus 1,000 mL (0 mLs Intravenous Stopped 10/06/17 0215)  HYDROmorphone (DILAUDID) injection 1 mg (1 mg Intravenous Given 10/06/17 0108)  ondansetron (ZOFRAN) injection 4 mg (4 mg Intravenous Given 10/06/17 0107)  gi cocktail (Maalox,Lidocaine,Donnatal) (30 mLs Oral Given 10/06/17 0107)  iopamidol (ISOVUE-300) 61 % injection 100 mL (  100 mLs Intravenous Contrast Given 10/06/17 0123)  HYDROcodone-acetaminophen (NORCO/VICODIN) 5-325 MG per tablet 2 tablet (2 tablets Oral Given 10/06/17 0210)  gi cocktail (Maalox,Lidocaine,Donnatal) (30 mLs Oral Given 10/06/17 0210)  metoCLOPramide (REGLAN) injection 10 mg (10 mg Intravenous Given 10/06/17 0210)  diphenhydrAMINE (BENADRYL) injection 25 mg (25 mg Intravenous Given 10/06/17 0210)  sodium chloride 0.9 % bolus 1,000 mL (0 mLs Intravenous Stopped 10/06/17 0346)     Initial Impression / Assessment and Plan / ED Course  I have reviewed the triage vital signs and the nursing notes.  Pertinent labs & imaging results that were available during my care of the patient were reviewed by me and considered in my medical decision making (see chart for details).    32 year old female here with recurrent, ongoing epigastric abdominal pain.  Patient was just seen and has not been taking her antacids.  On arrival, patient does appear in moderate distress.  She has a mild lactic acidosis.  I suspect this is due to dehydration and vomiting, but given her recurrent visits, CT scan obtained.  CT scan shows no acute abnormality.  Her lab work is otherwise reassuring with normal renal function and LFTs.  She has a mild, likely reactive leukocytosis without left shift.  Her lactic acid completely cleared to less than 1.  She feels markedly improved with symptomatic control in the ED and is now tolerating p.o. without difficulty.  Her vital signs are stable.  Concern for ongoing gastritis/peptic ulcer disease, with  consideration of chronic pancreatitis as well.  Patient may also have IBS.  Will start on Bentyl, give supportive care and analgesia, and refer to GI.  Good return precautions given..   Final Clinical Impressions(s) / ED Diagnoses   Final diagnoses:  Epigastric pain  Chronic pancreatitis, unspecified pancreatitis type [facility])    ED Discharge Orders        Ordered    HYDROcodone-acetaminophen (NORCO/VICODIN) 5-325 MG tablet  Every 6 hours PRN     10/06/17 0330    ondansetron (ZOFRAN ODT) 4 MG disintegrating tablet  Every 8 hours PRN     10/06/17 0330    dicyclomine (BENTYL) 20 MG tablet  3 times daily before meals & bedtime     10/06/17 0330       Duffy Bruce, MD 10/06/17 0410

## 2017-10-06 NOTE — Discharge Instructions (Signed)
As we discussed, take the antacids prescribed at your previous visit, in addition to the medications are prescribed today.  I prescribed a stronger pain medicine for when you needed.  I recommend taking a stool softener with it to prevent constipation.  For the next 24 hours, I recommend a clear liquid diet.  Start with Jell-O and water, then advance to chicken broth.  Then, follow the gallbladder eating plan for a low-fat diet.  Follow-up with your doctor and I recommend outpatient referral to a GI specialist.

## 2017-10-06 NOTE — ED Triage Notes (Addendum)
Epigastric pain x 1 hr, vomited, chills and pain that shoots into kidneys. Was eval x 2 days ago for same, crying in triage . Has not filled scripts that was given x 2 days ago

## 2018-06-14 ENCOUNTER — Emergency Department (HOSPITAL_BASED_OUTPATIENT_CLINIC_OR_DEPARTMENT_OTHER)
Admission: EM | Admit: 2018-06-14 | Discharge: 2018-06-14 | Disposition: A | Discharge status: Home / Self Care | Payer: BC Managed Care – PPO | Attending: Emergency Medicine | Admitting: Emergency Medicine

## 2018-06-14 ENCOUNTER — Encounter (HOSPITAL_BASED_OUTPATIENT_CLINIC_OR_DEPARTMENT_OTHER): Payer: Self-pay | Admitting: Emergency Medicine

## 2018-06-14 ENCOUNTER — Other Ambulatory Visit: Payer: Self-pay

## 2018-06-14 DIAGNOSIS — R69 Illness, unspecified: Secondary | ICD-10-CM

## 2018-06-14 DIAGNOSIS — Z79899 Other long term (current) drug therapy: Secondary | ICD-10-CM | POA: Diagnosis not present

## 2018-06-14 DIAGNOSIS — J111 Influenza due to unidentified influenza virus with other respiratory manifestations: Secondary | ICD-10-CM | POA: Diagnosis not present

## 2018-06-14 DIAGNOSIS — R509 Fever, unspecified: Secondary | ICD-10-CM | POA: Diagnosis present

## 2018-06-14 MED ORDER — OSELTAMIVIR PHOSPHATE 75 MG PO CAPS: 75.0000 mg | ORAL_CAPSULE | Freq: Two times a day (BID) | ORAL | 0 refills | Status: DC

## 2018-06-14 NOTE — Discharge Instructions (Signed)
Please take Ibuprofen (Advil, motrin) and Tylenol (acetaminophen) to relieve your pain.  You may take up to 600 MG (3 pills) of normal strength ibuprofen every 8 hours as needed.  In between doses of ibuprofen you make take tylenol, up to 1,000 mg (two extra strength pills).  Do not take more than 3,000 mg tylenol in a 24 hour period.  Please check all medication labels as many medications such as pain and cold medications may contain tylenol.  Do not drink alcohol while taking these medications.  Do not take other NSAID'S while taking ibuprofen (such as aleve or naproxen).  Please take ibuprofen with food to decrease stomach upset.  Today your symptoms are consistent with a flu or a flulike illness.  As I did not test you for the flu we call this a flulike illness.  I have given you information to read on the flu as most of this still applies.  If you have not already, please consider getting a flu shot once you are well.  Please make sure to practice good hand hygiene to help prevent the spread of flu.  We discussed today that many symptoms of the flu such as fever, not feeling well, and body aches can also be the first signs of more serious illnesses or infections.  If you have any new or worsening symptoms please seek additional medical care and evaluation.

## 2018-06-14 NOTE — ED Provider Notes (Signed)
[facility] EMERGENCY DEPARTMENT Provider Note   CSN: [id number] Arrival date & time: 06/14/18  1738    History   Chief Complaint Chief Complaint  Patient presents with  . Fever    HPI Toni Fields is a 33 y.o. female who presents today for evalution of not feeling well.  She felt well when she went to bed last night.  She reports she has vomited two times, however has been able to eat and keep down her food since.  She reports subjective fevers, chills, headache and feeling weak.  The headache is not specifically concerning to her, she denies any neck pain or stiffness.  no meds tried PTA.    She reports nasal congestion, however attributes that to allergies.  Denies any recent travel, no known sick contacts.  She did not get a flu shot.  No cough, shob, CP.  No sore throat or abdominal pain.       HPI  Past Medical History:  Diagnosis Date  . Abdominal pain   . Acute biliary pancreatitis   . Endometriosis   . Hernia, hiatal   . IBS (irritable bowel syndrome)   . IC (interstitial cystitis)   . Migraine     Patient Active Problem List   Diagnosis Date Noted  . Dysmenorrhea 08/02/2011  . Endometriosis 08/02/2011  . Infertility, female 08/02/2011  . Hidradenitis 08/02/2011  . Obesity 08/02/2011    Past Surgical History:  Procedure Laterality Date  . CHOLECYSTECTOMY    . CYSTOSCOPY    . EXPLORATORY LAPAROTOMY  4/12   endometrosis  . NASAL ENDOSCOPY    . WISDOM TOOTH EXTRACTION       OB History    Gravida  0   Para      Term      Preterm      AB      Living        SAB      TAB      Ectopic      Multiple      Live Births               Home Medications    Prior to Admission medications   Medication Sig Start Date End Date Taking? Authorizing Provider  norethindrone (MICRONOR,CAMILA,ERRIN) 0.35 MG tablet Take by mouth. 10/06/17  Yes [provider]  acetaminophen (TYLENOL) 500 MG tablet Take 1,000 mg by mouth  every 6 (six) hours as needed. For headache     [provider]  dicyclomine (BENTYL) 20 MG tablet Take 1 tablet (20 mg total) by mouth 4 (four) times daily -  before meals and at bedtime for 7 days. 10/06/17 10/13/17  Duffy Bruce, MD  HYDROcodone-acetaminophen (NORCO/VICODIN) 5-325 MG tablet Take 1-2 tablets by mouth every 6 (six) hours as needed for moderate pain or severe pain. 10/06/17   Duffy Bruce, MD  ibuprofen (ADVIL,MOTRIN) 800 MG tablet Take 1 tablet (800 mg total) by mouth every 8 (eight) hours as needed. 10/05/17   Long, Wonda Olds, MD  Meth-Hyo-M Barnett Hatter Phos-Ph Sal (URIBEL) 118 MG CAPS TAKE 1 CAPSULE BY MOUTH EVERY DAY AS NEEDED 03/22/18   [provider]  omeprazole (PRILOSEC) 10 MG capsule Take 10 mg by mouth daily as needed. For heartburn    [provider]  ondansetron (ZOFRAN ODT) 4 MG disintegrating tablet Take 1 tablet (4 mg total) by mouth every 8 (eight) hours as needed for nausea or vomiting. 10/06/17   Duffy Bruce,  MD  oseltamivir (TAMIFLU) 75 MG capsule Take 1 capsule (75 mg total) by mouth every 12 (twelve) hours. 06/14/18   Lorin Glass, PA-C  pantoprazole (PROTONIX) 20 MG tablet Take 1 tablet (20 mg total) by mouth daily. 10/05/17 11/04/17  Long, Wonda Olds, MD  sucralfate (CARAFATE) 1 GM/10ML suspension Take 10 mLs (1 g total) by mouth 4 (four) times daily -  with meals and at bedtime. 10/05/17   Long, Wonda Olds, MD  traMADol (ULTRAM) 50 MG tablet Take 1 tablet (50 mg total) by mouth every 6 (six) hours as needed for pain. 1-2 PO Q 4-6 HOURS PRN. 04/16/12   Ena Dawley, MD    Family History Family History  Problem Relation Age of Onset  . Diabetes Paternal Grandfather   . Breast cancer Maternal Grandmother   . Cancer Maternal Grandmother        ovarian  . Diabetes Maternal Grandfather   . Heart disease Father   . Diabetes Mother   . Hypertension Mother   . Hyperlipidemia Mother   . Heart disease Paternal Uncle   . Heart disease  Paternal Uncle   . Diabetes Paternal Uncle   . Heart disease Paternal Uncle   . Cancer Paternal Uncle        pancreatic  . Heart disease Paternal Uncle   . Diabetes Paternal Uncle   . Cancer Paternal Grandmother     Social History Social History   Tobacco Use  . Smoking status: Never Smoker  . Smokeless tobacco: Never Used  Substance Use Topics  . Alcohol use: Not Currently  . Drug use: No     Allergies   Amoxicillin-pot clavulanate; Sulfa antibiotics; and Latex   Review of Systems Review of Systems  Constitutional: Positive for fever (Subjective). Negative for unexpected weight change.  Eyes: Negative for photophobia (Mild) and visual disturbance.  Respiratory: Negative for chest tightness and shortness of breath.   Cardiovascular: Negative for chest pain.  Gastrointestinal: Negative for abdominal pain.  Musculoskeletal: Positive for arthralgias and myalgias.  Skin: Negative for rash.  Neurological: Positive for headaches.  All other systems reviewed and are negative.    Physical Exam Updated Vital Signs BP 114/76 (BP Location: Left Arm)   Pulse 82   Temp 98.3 F (36.8 C) (Oral)   Resp 12   Ht 5\' 6"  (1.676 m)   Wt 83.9 kg   LMP 06/12/2018 (Approximate)   SpO2 100%   BMI 29.86 kg/m   Physical Exam Constitutional:      General: She is not in acute distress.    Appearance: She is well-developed. She is not diaphoretic.  HENT:     Head: Normocephalic and atraumatic.     Right Ear: Tympanic membrane, ear canal and external ear normal.     Left Ear: Tympanic membrane, ear canal and external ear normal.     Nose: Mucosal edema present. No rhinorrhea.     Mouth/Throat:     Mouth: Mucous membranes are moist.     Pharynx: Oropharynx is clear. Uvula midline. No oropharyngeal exudate or posterior oropharyngeal erythema.     Tonsils: No tonsillar exudate.  Eyes:     General: No scleral icterus.    Conjunctiva/sclera: Conjunctivae normal.  Neck:      Musculoskeletal: Normal range of motion and neck supple.  Cardiovascular:     Rate and Rhythm: Normal rate and regular rhythm.     Pulses: Normal pulses.     Heart sounds: Normal heart sounds.  Pulmonary:     Effort: Pulmonary effort is normal. No respiratory distress.     Breath sounds: Normal breath sounds. No wheezing or rhonchi.  Abdominal:     General: Abdomen is flat. Bowel sounds are normal. There is no distension.     Palpations: There is no mass.     Tenderness: There is no abdominal tenderness.     Hernia: No hernia is present.  Lymphadenopathy:     Cervical: No cervical adenopathy.  Skin:    General: Skin is warm and dry.  Neurological:     General: No focal deficit present.     Mental Status: She is alert. Mental status is at baseline.  Psychiatric:        Mood and Affect: Mood normal.        Behavior: Behavior normal.      ED Treatments / Results  Labs (all labs ordered are listed, but only abnormal results are displayed) Labs Reviewed - No data to display  EKG None  Radiology No results found.  Procedures Procedures (including critical care time)  Medications Ordered in ED Medications - No data to display   Initial Impression / Assessment and Plan / ED Course  I have reviewed the triage vital signs and the nursing notes.  Pertinent labs & imaging results that were available during my care of the patient were reviewed by me and considered in my medical decision making (see chart for details).       Patient with symptoms consistent with influenza.  Vitals are stable, low-grade fever.  No signs of dehydration, tolerating PO's.  Lungs are clear. Due to patient's presentation and physical exam a chest x-ray was not ordered bc likely diagnosis of flu.  Discussed Tamiflu indications with patient.  She has [place] heritage, and therefore Tamiflu is indicated.  Patient will be discharged with instructions to orally hydrate, rest, and use  over-the-counter medications such as anti-inflammatories ibuprofen and Aleve for muscle aches and Tylenol for fever.    No recent travel.   Final Clinical Impressions(s) / ED Diagnoses   Final diagnoses:  Influenza-like illness    ED Discharge Orders         Ordered    oseltamivir (TAMIFLU) 75 MG capsule  Every 12 hours     06/14/18 1848           Ollen Gross 06/14/18 2048    Charlesetta Shanks, MD 06/16/18 1312

## 2018-06-14 NOTE — ED Triage Notes (Signed)
Reports fever with vomiting x2 which began today.  Additionally c/o headache and generalized body aches.

## 2019-01-01 ENCOUNTER — Emergency Department (HOSPITAL_BASED_OUTPATIENT_CLINIC_OR_DEPARTMENT_OTHER)
Admission: EM | Admit: 2019-01-01 | Discharge: 2019-01-01 | Discharge status: Against Medical Advice | Payer: BC Managed Care – PPO | Attending: Emergency Medicine | Admitting: Emergency Medicine

## 2019-01-01 ENCOUNTER — Encounter (HOSPITAL_BASED_OUTPATIENT_CLINIC_OR_DEPARTMENT_OTHER): Payer: Self-pay

## 2019-01-01 ENCOUNTER — Other Ambulatory Visit: Payer: Self-pay

## 2019-01-01 DIAGNOSIS — Z5321 Procedure and treatment not carried out due to patient leaving prior to being seen by health care provider: Secondary | ICD-10-CM | POA: Insufficient documentation

## 2019-01-01 DIAGNOSIS — R1110 Vomiting, unspecified: Secondary | ICD-10-CM | POA: Insufficient documentation

## 2019-01-01 DIAGNOSIS — R1032 Left lower quadrant pain: Secondary | ICD-10-CM | POA: Diagnosis not present

## 2019-01-01 DIAGNOSIS — R102 Pelvic and perineal pain: Secondary | ICD-10-CM | POA: Diagnosis present

## 2019-01-01 NOTE — ED Triage Notes (Signed)
Pt c/o lower abd/pelvic pain that radiates to left flank day 2-vomited x 2-NAD-steady gait

## 2019-07-11 ENCOUNTER — Ambulatory Visit: Payer: BC Managed Care – PPO

## 2019-08-08 ENCOUNTER — Emergency Department (HOSPITAL_BASED_OUTPATIENT_CLINIC_OR_DEPARTMENT_OTHER): Payer: BC Managed Care – PPO

## 2019-08-08 ENCOUNTER — Encounter (HOSPITAL_BASED_OUTPATIENT_CLINIC_OR_DEPARTMENT_OTHER): Payer: Self-pay

## 2019-08-08 ENCOUNTER — Emergency Department (HOSPITAL_BASED_OUTPATIENT_CLINIC_OR_DEPARTMENT_OTHER)
Admission: EM | Admit: 2019-08-08 | Discharge: 2019-08-09 | Disposition: A | Discharge status: Home / Self Care | Payer: BC Managed Care – PPO | Attending: Emergency Medicine | Admitting: Emergency Medicine

## 2019-08-08 ENCOUNTER — Other Ambulatory Visit: Payer: Self-pay

## 2019-08-08 DIAGNOSIS — R1030 Lower abdominal pain, unspecified: Secondary | ICD-10-CM | POA: Diagnosis present

## 2019-08-08 DIAGNOSIS — N83201 Unspecified ovarian cyst, right side: Secondary | ICD-10-CM | POA: Insufficient documentation

## 2019-08-08 DIAGNOSIS — Z79899 Other long term (current) drug therapy: Secondary | ICD-10-CM | POA: Diagnosis not present

## 2019-08-08 DIAGNOSIS — R109 Unspecified abdominal pain: Secondary | ICD-10-CM

## 2019-08-08 DIAGNOSIS — Z91040 Latex allergy status: Secondary | ICD-10-CM | POA: Diagnosis not present

## 2019-08-08 DIAGNOSIS — Z882 Allergy status to sulfonamides status: Secondary | ICD-10-CM | POA: Diagnosis not present

## 2019-08-08 DIAGNOSIS — Z880 Allergy status to penicillin: Secondary | ICD-10-CM | POA: Diagnosis not present

## 2019-08-08 LAB — URINALYSIS, ROUTINE W REFLEX MICROSCOPIC
Bilirubin Urine: NEGATIVE
Glucose, UA: NEGATIVE mg/dL
Ketones, ur: NEGATIVE mg/dL
Leukocytes,Ua: NEGATIVE
Nitrite: NEGATIVE
Protein, ur: NEGATIVE mg/dL
Specific Gravity, Urine: >1.03 — ABNORMAL HIGH (ref 1.005–1.030)
pH: 6 (ref 5.0–8.0)

## 2019-08-08 LAB — COMPREHENSIVE METABOLIC PANEL
ALT: 20 U/L (ref 0–44)
AST: 18 U/L (ref 15–41)
Albumin: 3.6 g/dL (ref 3.5–5.0)
Alkaline Phosphatase: 60 U/L (ref 38–126)
Anion gap: 6 (ref 5–15)
BUN: 12 mg/dL (ref 6–20)
CO2: 24 mmol/L (ref 22–32)
Calcium: 8.9 mg/dL (ref 8.9–10.3)
Chloride: 108 mmol/L (ref 98–111)
Creatinine, Ser: 0.72 mg/dL (ref 0.44–1.00)
GFR calc Af Amer: >60 mL/min (ref >60)
GFR calc non Af Amer: >60 mL/min (ref >60)
Glucose, Bld: 101 mg/dL — ABNORMAL HIGH (ref 70–99)
Potassium: 4.6 mmol/L (ref 3.5–5.1)
Sodium: 138 mmol/L (ref 135–145)
Total Bilirubin: 0.2 mg/dL — ABNORMAL LOW (ref 0.3–1.2)
Total Protein: 6.9 g/dL (ref 6.5–8.1)

## 2019-08-08 LAB — CBC
HCT: 42.4 % (ref 36.0–46.0)
Hemoglobin: 14.5 g/dL (ref 12.0–15.0)
MCH: 32.2 pg (ref 26.0–34.0)
MCHC: 34.2 g/dL (ref 30.0–36.0)
MCV: 94 fL (ref 80.0–100.0)
Platelets: 270 10*3/uL (ref 150–400)
RBC: 4.51 MIL/uL (ref 3.87–5.11)
RDW: 13.1 % (ref 11.5–15.5)
WBC: 12.8 10*3/uL — ABNORMAL HIGH (ref 4.0–10.5)
nRBC: 0 % (ref 0.0–0.2)

## 2019-08-08 LAB — URINALYSIS, MICROSCOPIC (REFLEX): WBC, UA: NONE SEEN WBC/hpf (ref 0–5)

## 2019-08-08 LAB — LIPASE, BLOOD: Lipase: 40 U/L (ref 11–51)

## 2019-08-08 LAB — PREGNANCY, URINE: Preg Test, Ur: NEGATIVE

## 2019-08-08 MED ORDER — IOHEXOL 300 MG/ML  SOLN
100.0000 mL | Freq: Once | INTRAMUSCULAR | Status: AC
  Administered 2019-08-08: 22:00:00 100 mL via INTRAVENOUS

## 2019-08-08 MED ORDER — ONDANSETRON HCL 4 MG/2ML IJ SOLN
4.0000 mg | Freq: Once | INTRAMUSCULAR | Status: AC
  Administered 2019-08-08: 4 mg via INTRAVENOUS
  Filled 2019-08-08: qty 2

## 2019-08-08 MED ORDER — KETOROLAC TROMETHAMINE 15 MG/ML IJ SOLN
15.0000 mg | Freq: Once | INTRAMUSCULAR | Status: AC
  Administered 2019-08-08: 15 mg via INTRAVENOUS
  Filled 2019-08-08: qty 1

## 2019-08-08 MED ORDER — LACTATED RINGERS IV BOLUS
1000.0000 mL | Freq: Once | INTRAVENOUS | Status: AC
  Administered 2019-08-08: 1000 mL via INTRAVENOUS

## 2019-08-08 MED ORDER — HYDROMORPHONE HCL 1 MG/ML IJ SOLN
1.0000 mg | Freq: Once | INTRAMUSCULAR | Status: AC
  Administered 2019-08-08: 1 mg via INTRAVENOUS
  Filled 2019-08-08: qty 1

## 2019-08-08 MED ORDER — MORPHINE SULFATE (PF) 4 MG/ML IV SOLN
4.0000 mg | Freq: Once | INTRAVENOUS | Status: AC
  Administered 2019-08-08: 4 mg via INTRAVENOUS
  Filled 2019-08-08: qty 1

## 2019-08-08 MED ORDER — SODIUM CHLORIDE 0.9% FLUSH
3.0000 mL | Freq: Once | INTRAVENOUS | Status: DC
  Filled 2019-08-08: qty 3

## 2019-08-08 MED ORDER — HYDROCODONE-ACETAMINOPHEN 5-325 MG PO TABS: 1.0000 | ORAL_TABLET | Freq: Four times a day (QID) | ORAL | 0 refills | Status: DC | PRN

## 2019-08-08 NOTE — ED Notes (Signed)
Patient transported to CT 

## 2019-08-08 NOTE — ED Provider Notes (Signed)
[facility] EMERGENCY DEPARTMENT Provider Note   CSN: [id number] Arrival date & time: 08/08/19  1916     History Chief Complaint  Patient presents with  . Abdominal Pain    Toni Fields is a 34 y.o. female.  HPI   34 year old female with abdominal pain.  Lower abdomen.  Onset about a day ago.  Pain is been pretty persistent since onset.  Hurts across the lower abdomen.  Does not lateralize.  She has a past history of endometriosis.  States that this pain feels different than what she is experienced with it previously.  No acute urinary complaints.  No unusual vaginal bleeding or discharge.  Past Medical History:  Diagnosis Date  . Abdominal pain   . Acute biliary pancreatitis   . Endometriosis   . Hernia, hiatal   . IBS (irritable bowel syndrome)   . IC (interstitial cystitis)   . Migraine     Patient Active Problem List   Diagnosis Date Noted  . Dysmenorrhea 08/02/2011  . Endometriosis 08/02/2011  . Infertility, female 08/02/2011  . Hidradenitis 08/02/2011  . Obesity 08/02/2011    Past Surgical History:  Procedure Laterality Date  . CHOLECYSTECTOMY    . CYSTOSCOPY    . EXPLORATORY LAPAROTOMY  4/12   endometrosis  . NASAL ENDOSCOPY    . WISDOM TOOTH EXTRACTION       OB History    Gravida  0   Para      Term      Preterm      AB      Living        SAB      TAB      Ectopic      Multiple      Live Births              Family History  Problem Relation Age of Onset  . Diabetes Paternal Grandfather   . Breast cancer Maternal Grandmother   . Cancer Maternal Grandmother        ovarian  . Diabetes Maternal Grandfather   . Heart disease Father   . Diabetes Mother   . Hypertension Mother   . Hyperlipidemia Mother   . Heart disease Paternal Uncle   . Heart disease Paternal Uncle   . Diabetes Paternal Uncle   . Heart disease Paternal Uncle   . Cancer Paternal Uncle        pancreatic  . Heart disease Paternal Uncle   .  Diabetes Paternal Uncle   . Cancer Paternal Grandmother     Social History   Tobacco Use  . Smoking status: Never Smoker  . Smokeless tobacco: Never Used  Substance Use Topics  . Alcohol use: Not Currently  . Drug use: No    Home Medications Prior to Admission medications   Medication Sig Start Date End Date Taking? Authorizing Provider  acetaminophen (TYLENOL) 500 MG tablet Take 1,000 mg by mouth every 6 (six) hours as needed. For headache     [provider]  dicyclomine (BENTYL) 20 MG tablet Take 1 tablet (20 mg total) by mouth 4 (four) times daily -  before meals and at bedtime for 7 days. 10/06/17 10/13/17  Duffy Bruce, MD  HYDROcodone-acetaminophen (NORCO/VICODIN) 5-325 MG tablet Take 1-2 tablets by mouth every 6 (six) hours as needed for moderate pain or severe pain. 10/06/17   Duffy Bruce, MD  ibuprofen (ADVIL,MOTRIN) 800 MG tablet Take 1 tablet (800 mg total) by mouth every  8 (eight) hours as needed. 10/05/17   Long, Wonda Olds, MD  Meth-Hyo-M Barnett Hatter Phos-Ph Sal (URIBEL) 118 MG CAPS TAKE 1 CAPSULE BY MOUTH EVERY DAY AS NEEDED 03/22/18   [provider]  norethindrone (MICRONOR,CAMILA,ERRIN) 0.35 MG tablet Take by mouth. 10/06/17   [provider]  omeprazole (PRILOSEC) 10 MG capsule Take 10 mg by mouth daily as needed. For heartburn    [provider]  ondansetron (ZOFRAN ODT) 4 MG disintegrating tablet Take 1 tablet (4 mg total) by mouth every 8 (eight) hours as needed for nausea or vomiting. 10/06/17   Duffy Bruce, MD  oseltamivir (TAMIFLU) 75 MG capsule Take 1 capsule (75 mg total) by mouth every 12 (twelve) hours. 06/14/18   Lorin Glass, PA-C  pantoprazole (PROTONIX) 20 MG tablet Take 1 tablet (20 mg total) by mouth daily. 10/05/17 11/04/17  Long, Wonda Olds, MD  sucralfate (CARAFATE) 1 GM/10ML suspension Take 10 mLs (1 g total) by mouth 4 (four) times daily -  with meals and at bedtime. 10/05/17   Long, Wonda Olds, MD  traMADol (ULTRAM) 50 MG  tablet Take 1 tablet (50 mg total) by mouth every 6 (six) hours as needed for pain. 1-2 PO Q 4-6 HOURS PRN. 04/16/12   Ena Dawley, MD    Allergies    Amoxicillin-pot clavulanate, Sulfa antibiotics, and Latex  Review of Systems   Review of Systems All systems reviewed and negative, other than as noted in HPI.  Physical Exam Updated Vital Signs BP 114/79 (BP Location: Right Arm)   Pulse (!) 59   Temp 98.4 F (36.9 C) (Oral)   Resp 16   Ht 5\' 6"  (1.676 m)   Wt 89.8 kg   LMP 07/13/2019   SpO2 99%   BMI 31.96 kg/m   Physical Exam Vitals and nursing note reviewed.  Constitutional:      General: She is not in acute distress.    Appearance: She is well-developed.  HENT:     Head: Normocephalic and atraumatic.  Eyes:     General:        Right eye: No discharge.        Left eye: No discharge.     Conjunctiva/sclera: Conjunctivae normal.  Cardiovascular:     Rate and Rhythm: Normal rate and regular rhythm.     Heart sounds: Normal heart sounds. No murmur. No friction rub. No gallop.   Pulmonary:     Effort: Pulmonary effort is normal. No respiratory distress.     Breath sounds: Normal breath sounds.  Abdominal:     General: There is no distension.     Palpations: Abdomen is soft.     Tenderness: There is no abdominal tenderness.     Comments: Tenderness in the lower abdomen without rebound or guarding.  Musculoskeletal:        General: No tenderness.     Cervical back: Neck supple.  Skin:    General: Skin is warm and dry.  Neurological:     Mental Status: She is alert.  Psychiatric:        Behavior: Behavior normal.        Thought Content: Thought content normal.     ED Results / Procedures / Treatments   Labs (all labs ordered are listed, but only abnormal results are displayed) Labs Reviewed  COMPREHENSIVE METABOLIC PANEL - Abnormal; Notable for the following components:      Result Value   Glucose, Bld 101 (*)    Total Bilirubin  0.2 (*)    All other  components within normal limits  CBC - Abnormal; Notable for the following components:   WBC 12.8 (*)    All other components within normal limits  URINALYSIS, ROUTINE W REFLEX MICROSCOPIC - Abnormal; Notable for the following components:   Specific Gravity, Urine >1.030 (*)    Hgb urine dipstick TRACE (*)    All other components within normal limits  URINALYSIS, MICROSCOPIC (REFLEX) - Abnormal; Notable for the following components:   Bacteria, UA RARE (*)    All other components within normal limits  LIPASE, BLOOD  PREGNANCY, URINE    EKG None  Radiology CT ABDOMEN PELVIS W CONTRAST  Result Date: 08/08/2019 CLINICAL DATA:  Acute abdominal pain. History of pancreatitis and endometriosis. Status post cholecystectomy. EXAM: CT ABDOMEN AND PELVIS WITH CONTRAST TECHNIQUE: Multidetector CT imaging of the abdomen and pelvis was performed using the standard protocol following bolus administration of intravenous contrast. CONTRAST:  152mL OMNIPAQUE IOHEXOL 300 MG/ML  SOLN COMPARISON:  CT dated October 06, 2017. FINDINGS: Lower chest: The lung bases are clear.The heart was not visualized on this study. Hepatobiliary: The dome of the liver was not visualized on this study. The visualized portions of the liver are unremarkable. Normal gallbladder.There is no biliary ductal dilation. Pancreas: Normal contours without ductal dilatation. No peripancreatic fluid collection. Spleen: Unremarkable. Adrenals/Urinary Tract: --Adrenal glands: Unremarkable. --Right kidney/ureter: No hydronephrosis or radiopaque kidney stones. --Left kidney/ureter: No hydronephrosis or radiopaque kidney stones. --Urinary bladder: Unremarkable. Stomach/Bowel: --Stomach/Duodenum: No hiatal hernia or other gastric abnormality. Normal duodenal course and caliber. --Small bowel: Unremarkable. --Colon: There are scattered colonic diverticula without CT evidence for diverticulitis. --Appendix: Normal. Vascular/Lymphatic: Normal course and caliber  of the major abdominal vessels. --No retroperitoneal lymphadenopathy. --No mesenteric lymphadenopathy. --No pelvic or inguinal lymphadenopathy. Reproductive: There is a right ovarian cystic mass measuring approximately 4.5 cm. The left ovary is unremarkable. Other: There is a small volume of pelvic free fluid which is likely physiologic. No free air. The abdominal wall is normal. Musculoskeletal. No acute displaced fractures. IMPRESSION: 1. No acute abnormality. 2. Scattered colonic diverticula without CT evidence for diverticulitis. 3. Normal appendix in the right lower quadrant. 4. There is a 4.5 cm right ovarian cystic mass. No further follow-up is required. This recommendation follows ACR consensus guidelines: White Paper of the ACR Incidental Findings Committee II on Adnexal Findings. J Am Coll Radiol [phone]. Electronically Signed   By: Constance Holster M.D.   On: 08/08/2019 22:35    Procedures Procedures (including critical care time)  Medications Ordered in ED Medications  sodium chloride flush (NS) 0.9 % injection 3 mL (has no administration in time range)  morphine 4 MG/ML injection 4 mg (has no administration in time range)  ondansetron (ZOFRAN) injection 4 mg (4 mg Intravenous Given 08/08/19 2214)  ketorolac (TORADOL) 15 MG/ML injection 15 mg (15 mg Intravenous Given 08/08/19 2215)  HYDROmorphone (DILAUDID) injection 1 mg (1 mg Intravenous Given 08/08/19 2215)  lactated ringers bolus 1,000 mL (1,000 mLs Intravenous New Bag/Given 08/08/19 2214)  iohexol (OMNIPAQUE) 300 MG/ML solution 100 mL (100 mLs Intravenous Contrast Given 08/08/19 2206)    ED Course  I have reviewed the triage vital signs and the nursing notes.  Pertinent labs & imaging results that were available during my care of the patient were reviewed by me and considered in my medical decision making (see chart for details).    MDM Rules/Calculators/A&P  34 year old female with lower abdominal pain.   Possibly from ovarian cyst?  Imaging otherwise fairly unremarkable last acute pathology.  No peritonitis on exam.  Labs fairly reassuring.  Symptoms improved with medications.  Plan continue symptomatic treatment.  Return cautions discussed.  Outpatient follow-up otherwise. Final Clinical Impression(s) / ED Diagnoses Final diagnoses:  Abdominal pain, unspecified abdominal location  Cyst of right ovary    Rx / DC Orders ED Discharge Orders    None       Virgel Manifold, MD 08/12/19 1223

## 2019-08-08 NOTE — ED Triage Notes (Signed)
Pt c/o lower abd pain,lower back pain,n/v/d x today-NAD-steady gait

## 2019-08-22 ENCOUNTER — Ambulatory Visit: Payer: BC Managed Care – PPO | Attending: Family

## 2019-08-22 DIAGNOSIS — Z23 Encounter for immunization: Secondary | ICD-10-CM

## 2019-08-22 NOTE — Progress Notes (Signed)
   Covid-19 Vaccination Clinic  Name:  Toni Fields    MRN: [id number] DOB: [birth year]  08/22/2019  Toni Fields was observed post Covid-19 immunization for 15 minutes without incident. She was provided with Vaccine Information Sheet and instruction to access the V-Safe system.   Toni Fields was instructed to call 911 with any severe reactions post vaccine: Marland Kitchen Difficulty breathing  . Swelling of face and throat  . A fast heartbeat  . A bad rash all over body  . Dizziness and weakness   Immunizations Administered    Name Date Dose VIS Date Route   Moderna COVID-19 Vaccine 08/22/2019  1:53 PM 0.5 mL 03/2019 Intramuscular   Manufacturer: Moderna   Lot: [id number]   [id number]

## 2019-09-17 ENCOUNTER — Ambulatory Visit: Payer: BC Managed Care – PPO | Attending: Family

## 2019-09-17 DIAGNOSIS — Z23 Encounter for immunization: Secondary | ICD-10-CM

## 2019-09-17 NOTE — Progress Notes (Signed)
   Covid-19 Vaccination Clinic  Name:  Toni Fields    MRN: [id number] DOB: June 02, 1985  09/17/2019  [name] was observed post Covid-19 immunization for 15 minutes without incident. She was provided with Vaccine Information Sheet and instruction to access the V-Safe system.   [name] was instructed to call 911 with any severe reactions post vaccine: Marland Kitchen Difficulty breathing  . Swelling of face and throat  . A fast heartbeat  . A bad rash all over body  . Dizziness and weakness   Immunizations Administered    Name Date Dose VIS Date Route   Moderna COVID-19 Vaccine 09/17/2019  1:02 PM 0.5 mL 03/2019 Intramuscular   Manufacturer: Moderna   Lot: [id number]   [place]: [id number]

## 2019-10-04 ENCOUNTER — Emergency Department (HOSPITAL_BASED_OUTPATIENT_CLINIC_OR_DEPARTMENT_OTHER)
Admission: EM | Admit: 2019-10-04 | Discharge: 2019-10-04 | Disposition: A | Discharge status: Home / Self Care | Payer: BC Managed Care – PPO | Attending: Emergency Medicine | Admitting: Emergency Medicine

## 2019-10-04 ENCOUNTER — Other Ambulatory Visit: Payer: Self-pay

## 2019-10-04 ENCOUNTER — Encounter (HOSPITAL_BASED_OUTPATIENT_CLINIC_OR_DEPARTMENT_OTHER): Payer: Self-pay

## 2019-10-04 DIAGNOSIS — R112 Nausea with vomiting, unspecified: Secondary | ICD-10-CM | POA: Diagnosis not present

## 2019-10-04 DIAGNOSIS — G43409 Hemiplegic migraine, not intractable, without status migrainosus: Secondary | ICD-10-CM | POA: Insufficient documentation

## 2019-10-04 DIAGNOSIS — R519 Headache, unspecified: Secondary | ICD-10-CM | POA: Diagnosis present

## 2019-10-04 DIAGNOSIS — Z880 Allergy status to penicillin: Secondary | ICD-10-CM | POA: Insufficient documentation

## 2019-10-04 MED ORDER — KETOROLAC TROMETHAMINE 15 MG/ML IJ SOLN
15.0000 mg | Freq: Once | INTRAMUSCULAR | Status: AC
  Administered 2019-10-04: 15 mg via INTRAVENOUS
  Filled 2019-10-04: qty 1

## 2019-10-04 MED ORDER — DIPHENHYDRAMINE HCL 50 MG/ML IJ SOLN
25.0000 mg | Freq: Once | INTRAMUSCULAR | Status: AC
  Administered 2019-10-04: 25 mg via INTRAVENOUS
  Filled 2019-10-04: qty 1

## 2019-10-04 MED ORDER — SODIUM CHLORIDE 0.9 % IV BOLUS
500.0000 mL | Freq: Once | INTRAVENOUS | Status: AC
  Administered 2019-10-04: 500 mL via INTRAVENOUS

## 2019-10-04 MED ORDER — METOCLOPRAMIDE HCL 5 MG/ML IJ SOLN
10.0000 mg | Freq: Once | INTRAMUSCULAR | Status: AC
  Administered 2019-10-04: 10 mg via INTRAVENOUS
  Filled 2019-10-04: qty 2

## 2019-10-04 NOTE — ED Triage Notes (Signed)
Pt c/o migraine, n/v x today-NAD-steady gait

## 2019-10-04 NOTE — ED Provider Notes (Signed)
[facility] EMERGENCY DEPARTMENT Provider Note   CSN: [id number] Arrival date & time: 10/04/19  1944     History Chief Complaint  Patient presents with  . Migraine    Toni Fields is a 34 y.o. female.  Patient presents to the emergency department tonight for migraine.  She has a history of such.  Patient states that she went to the gym and worked out and then took a nap this afternoon.  Around 6 PM when she awoke, she had a headache on the right side of her head with associated light and sound sensitivity.  She saw floaters in her vision.  She had an episode of vomiting.  Pain radiates down to the right face and neck.  No fevers, confusion, neck stiffness.  Patient denies head injury.  No weakness, numbness, or tingling in the arms of the legs.  No treatments prior to arrival.  She states that symptoms are similar to previous migraines.        Past Medical History:  Diagnosis Date  . Abdominal pain   . Acute biliary pancreatitis   . Endometriosis   . Hernia, hiatal   . IBS (irritable bowel syndrome)   . IC (interstitial cystitis)   . Migraine     Patient Active Problem List   Diagnosis Date Noted  . Dysmenorrhea 08/02/2011  . Endometriosis 08/02/2011  . Infertility, female 08/02/2011  . Hidradenitis 08/02/2011  . Obesity 08/02/2011    Past Surgical History:  Procedure Laterality Date  . CHOLECYSTECTOMY    . CYSTOSCOPY    . EXPLORATORY LAPAROTOMY  4/12   endometrosis  . NASAL ENDOSCOPY    . WISDOM TOOTH EXTRACTION       OB History    Gravida  0   Para      Term      Preterm      AB      Living        SAB      TAB      Ectopic      Multiple      Live Births              Family History  Problem Relation Age of Onset  . Diabetes Paternal Grandfather   . Breast cancer Maternal Grandmother   . Cancer Maternal Grandmother        ovarian  . Diabetes Maternal Grandfather   . Heart disease Father   . Diabetes Mother   .  Hypertension Mother   . Hyperlipidemia Mother   . Heart disease Paternal Uncle   . Heart disease Paternal Uncle   . Diabetes Paternal Uncle   . Heart disease Paternal Uncle   . Cancer Paternal Uncle        pancreatic  . Heart disease Paternal Uncle   . Diabetes Paternal Uncle   . Cancer Paternal Grandmother     Social History   Tobacco Use  . Smoking status: Never Smoker  . Smokeless tobacco: Never Used  Vaping Use  . Vaping Use: Never used  Substance Use Topics  . Alcohol use: Not Currently  . Drug use: No    Home Medications Prior to Admission medications   Medication Sig Start Date End Date Taking? Authorizing Provider  acetaminophen (TYLENOL) 500 MG tablet Take 1,000 mg by mouth every 6 (six) hours as needed. For headache     [provider]  dicyclomine (BENTYL) 20 MG tablet Take 1 tablet (20 mg total)  by mouth 4 (four) times daily -  before meals and at bedtime for 7 days. 10/06/17 10/13/17  Duffy Bruce, MD  HYDROcodone-acetaminophen (NORCO/VICODIN) 5-325 MG tablet Take 1 tablet by mouth every 6 (six) hours as needed. 08/08/19   Virgel Manifold, MD  ibuprofen (ADVIL,MOTRIN) 800 MG tablet Take 1 tablet (800 mg total) by mouth every 8 (eight) hours as needed. 10/05/17   Long, Wonda Olds, MD  Meth-Hyo-M Barnett Hatter Phos-Ph Sal (URIBEL) 118 MG CAPS TAKE 1 CAPSULE BY MOUTH EVERY DAY AS NEEDED 03/22/18   [provider]  norethindrone (MICRONOR,CAMILA,ERRIN) 0.35 MG tablet Take by mouth. 10/06/17   [provider]  omeprazole (PRILOSEC) 10 MG capsule Take 10 mg by mouth daily as needed. For heartburn    [provider]  ondansetron (ZOFRAN ODT) 4 MG disintegrating tablet Take 1 tablet (4 mg total) by mouth every 8 (eight) hours as needed for nausea or vomiting. 10/06/17   Duffy Bruce, MD  oseltamivir (TAMIFLU) 75 MG capsule Take 1 capsule (75 mg total) by mouth every 12 (twelve) hours. 06/14/18   Lorin Glass, PA-C  pantoprazole (PROTONIX) 20 MG  tablet Take 1 tablet (20 mg total) by mouth daily. 10/05/17 11/04/17  Long, Wonda Olds, MD  sucralfate (CARAFATE) 1 GM/10ML suspension Take 10 mLs (1 g total) by mouth 4 (four) times daily -  with meals and at bedtime. 10/05/17   Long, Wonda Olds, MD  traMADol (ULTRAM) 50 MG tablet Take 1 tablet (50 mg total) by mouth every 6 (six) hours as needed for pain. 1-2 PO Q 4-6 HOURS PRN. 04/16/12   Ena Dawley, MD    Allergies    Amoxicillin-pot clavulanate, Sulfa antibiotics, and Latex  Review of Systems   Review of Systems  Constitutional: Negative for fever.  HENT: Negative for congestion, dental problem, rhinorrhea and sinus pressure.   Eyes: Negative for photophobia, discharge, redness and visual disturbance.  Respiratory: Negative for shortness of breath.   Cardiovascular: Negative for chest pain.  Gastrointestinal: Positive for nausea and vomiting.  Musculoskeletal: Negative for gait problem, neck pain and neck stiffness.  Skin: Negative for rash.  Neurological: Positive for headaches. Negative for syncope, speech difficulty, weakness, light-headedness and numbness.  Psychiatric/Behavioral: Negative for confusion.    Physical Exam Updated Vital Signs BP 109/77 (BP Location: Left Arm)   Pulse 83   Temp 98.1 F (36.7 C) (Oral)   Resp 18   Ht 5\' 6"  (1.676 m)   Wt 91.6 kg   LMP 09/22/2019   SpO2 99%   BMI 32.60 kg/m   Physical Exam Vitals and nursing note reviewed.  Constitutional:      Appearance: She is well-developed.  HENT:     Head: Normocephalic and atraumatic.     Right Ear: Tympanic membrane, ear canal and external ear normal.     Left Ear: Tympanic membrane, ear canal and external ear normal.     Nose: Nose normal.     Mouth/Throat:     Pharynx: Uvula midline.  Eyes:     General: Lids are normal.     Extraocular Movements:     Right eye: No nystagmus.     Left eye: No nystagmus.     Conjunctiva/sclera: Conjunctivae normal.     Pupils: Pupils are equal, round, and  reactive to light.  Neck:     Comments: Normal range of motion of neck without signs of meningismus. Cardiovascular:     Rate and Rhythm: Normal rate.  Pulmonary:  Effort: Pulmonary effort is normal.     Breath sounds: Normal breath sounds.  Abdominal:     Palpations: Abdomen is soft.     Tenderness: There is no abdominal tenderness.  Musculoskeletal:     Cervical back: Normal range of motion and neck supple. No tenderness or bony tenderness.  Skin:    General: Skin is warm and dry.  Neurological:     Mental Status: She is alert and oriented to person, place, and time.     GCS: GCS eye subscore is 4. GCS verbal subscore is 5. GCS motor subscore is 6.     Cranial Nerves: No cranial nerve deficit.     Sensory: No sensory deficit.     Coordination: Coordination normal.     Gait: Gait normal.     ED Results / Procedures / Treatments   Labs (all labs ordered are listed, but only abnormal results are displayed) Labs Reviewed - No data to display  EKG None  Radiology No results found.  Procedures Procedures (including critical care time)  Medications Ordered in ED Medications  metoCLOPramide (REGLAN) injection 10 mg (10 mg Intravenous Given 10/04/19 2056)  diphenhydrAMINE (BENADRYL) injection 25 mg (25 mg Intravenous Given 10/04/19 2053)  sodium chloride 0.9 % bolus 500 mL ( Intravenous Stopped 10/04/19 2127)  ketorolac (TORADOL) 15 MG/ML injection 15 mg (15 mg Intravenous Given 10/04/19 2138)    ED Course  I have reviewed the triage vital signs and the nursing notes.  Pertinent labs & imaging results that were available during my care of the patient were reviewed by me and considered in my medical decision making (see chart for details).  Patient seen and examined.  Patient with reassuring neuro exam.  Migraine cocktail ordered.  Will reassess.  Vital signs reviewed and are as follows: BP 109/77 (BP Location: Left Arm)   Pulse 83   Temp 98.1 F (36.7 C) (Oral)   Resp  18   Ht 5\' 6"  (1.676 m)   Wt 91.6 kg   LMP 09/22/2019   SpO2 99%   BMI 32.60 kg/m   Informed by RN that patient's only down to 8 out of 10.  Toradol ordered.  10:33 PM patient rechecked.  She states that her headache is getting much better.  Her sister is coming to pick her up from the emergency department.  She is comfortable with discharged home at this time.  Gross neuro exam is unchanged.  Encourage PCP follow-up for management of migraines.  Patient urged to return with worsening symptoms or other concerns. Patient verbalized understanding and agrees with plan. Patient counseled to return if they have weakness in their arms or legs, slurred speech, trouble walking or talking, confusion, trouble with their balance, or if they have any other concerns. Patient verbalizes understanding and agrees with plan.       MDM Rules/Calculators/A&P                          Patient without high-risk features of headache including: sudden onset/thunderclap HA, no similar headache in past, altered mental status, accompanying seizure, headache with exertion, age > 28, history of immunocompromise, neck or shoulder pain, fever, use of anticoagulation, family history of spontaneous SAH, concomitant drug use, toxic exposure.   Patient has a normal complete neurological exam, normal vital signs, normal level of consciousness, no signs of meningismus, is well-appearing/non-toxic appearing, no signs of trauma.   Imaging with CT/MRI not indicated given history  and physical exam findings.   No dangerous or life-threatening conditions suspected or identified by history, physical exam, and by work-up. No indications for hospitalization identified.   Final Clinical Impression(s) / ED Diagnoses Final diagnoses:  Hemiplegic migraine without status migrainosus, not intractable    Rx / DC Orders ED Discharge Orders    None       Carlisle Cater, Hershal Coria 10/04/19 2234    Isla Pence, MD 10/04/19  2245

## 2019-10-04 NOTE — Discharge Instructions (Signed)
Please read and follow all provided instructions.  Your diagnoses today include:  1. Hemiplegic migraine without status migrainosus, not intractable     Tests performed today include:  Vital signs. See below for your results today.   Medications:  In the Emergency Department you received:  Reglan - antinausea/headache medication  Benadryl - antihistamine to counteract potential side effects of reglan  Toradol - NSAID medication similar to ibuprofen  Take any prescribed medications only as directed.  Additional information:  Follow any educational materials contained in this packet.  You are having a headache. No specific cause was found today for your headache. It may have been a migraine or other cause of headache. Stress, anxiety, fatigue, and depression are common triggers for headaches.   Your headache today does not appear to be life-threatening or require hospitalization, but often the exact cause of headaches is not determined in the emergency department. Therefore, follow-up with your doctor is very important to find out what may have caused your headache and whether or not you need any further diagnostic testing or treatment.   Sometimes headaches can appear benign (not harmful), but then more serious symptoms can develop which should prompt an immediate re-evaluation by your doctor or the emergency department.  BE VERY CAREFUL not to take multiple medicines containing Tylenol (also called acetaminophen). Doing so can lead to an overdose which can damage your liver and cause liver failure and possibly death.   Follow-up instructions: Please follow-up with your primary care provider in the next 3 days for further evaluation of your symptoms.   Return instructions:   Please return to the Emergency Department if you experience worsening symptoms.  Return if the medications do not resolve your headache, if it recurs, or if you have multiple episodes of vomiting or cannot  keep down fluids.  Return if you have a change from the usual headache.  RETURN IMMEDIATELY IF you:  Develop a sudden, severe headache  Develop confusion or become poorly responsive or faint  Develop a fever above 100.17F or problem breathing  Have a change in speech, vision, swallowing, or understanding  Develop new weakness, numbness, tingling, incoordination in your arms or legs  Have a seizure  Please return if you have any other emergent concerns.  Additional Information:  Your vital signs today were: BP 109/77 (BP Location: Left Arm)   Pulse 83   Temp 98.1 F (36.7 C) (Oral)   Resp 18   Ht 5\' 6"  (1.676 m)   Wt 91.6 kg   LMP 09/22/2019   SpO2 99%   BMI 32.60 kg/m  If your blood pressure (BP) was elevated above 135/85 this visit, please have this repeated by your doctor within one month. --------------

## 2019-10-08 ENCOUNTER — Other Ambulatory Visit: Payer: Self-pay | Admitting: Obstetrics

## 2019-10-29 NOTE — Progress Notes (Signed)
[facility]    Provider:  Dr Jaynee Eagles Requesting Provider: Aloha Gell, MD Primary Care Provider:  Aloha Gell, MD  CC:  Headaches  HPI:  Toni Fields is a 34 y.o. female here as requested by Aloha Gell, MD for chronic migraines. PMHx: migraine, IBS. I reviewed notes from the emergency department she was last seen there in July 2 of this year for migraine.  Patient went to the gym and worked out on around 6 PM she had a headache with associated light and sound sensitivity, she saw floaters in her vision and an episode of vomiting, pain radiating down to the right face and neck, no confusion or neck stiffness no head injury no weakness or tingling in the arms of legs and no treatments prior to arrival.  Symptoms were similar to previous migraines.  Physical and neurologic exam were normal.  She was treated with Reglan, Benadryl, ketorolac injections and a bolus of normal saline.  She was discharged feeling better.  Patient's been to the emergency room multiple times in 2009 had a lumbar puncture for migraine which showed normal CSF cell count with differential, protein and glucose.  Patient here and reports she has been having intense headaches, the last one hit her in her sleep, made her nauseated, lasted 2 to 3 days and she went to the hospital.  She was seen by Dr. Farley Ly around the ages of 18-20 for the headaches but they went away for almost a decade and now have come back.  They are more intense than before, Meds tried include steroids, Imitrex, Topamax, ibuprofen 800 mg. They start at night, it starts in the neck and travels to the head both sides, blurry vision, tingling hands.It starts as sharo stabbing pain in the occipital area, she is not sleeping well, she is snoring, she gets tired during the day, she wakes with morning headaches as well. She has never had headaches in the morning or wake her up, this is new, it started again about a year ago started getting  them again and worsening 4-5 months ago. They are positional worse standing, nausea, vomiting, light and sound sensitivity, moving makes it worse. They can last 48-72 hours and it is debilitating. Cold packs help, has to be in complete darkness and stillness,  Ibuprofen does not help, no medication overuse, She has 12 migraine days a month and daily headache. She cannot have kids. No aura.   Reviewed notes, labs and imaging from outside physicians, which showed:  Will request Dr. Tressia Danas notes  CT head 02/27/2008 showed No acute intracranial abnormalities including mass lesion or mass effect, hydrocephalus, extra-axial fluid collection, midline shift, hemorrhage, or acute infarction, large ischemic events (personally reviewed images)  meds tried: topamax (did not tolerate), tylenol, amitriptyline, ibuprofen, ketorolac injections, ketorolac inj,  zofran injection, imitrex, tramadol, BP meds (propranolol, verapanil) contraindicated due to hypotension.  Review of Systems: Patient complains of symptoms per HPI as well as the following symptoms: headaches, weight gain, easy bruising, numbness, depression, dizziness, sleepiness. Pertinent negatives and positives per HPI. All others negative.   Social History   Socioeconomic History  . Marital status: Divorced    Spouse name: Not on file  . Number of children: 0  . Years of education: 2 yrs college  . Highest education level: Not on file  Occupational History  . Not on file  Tobacco Use  . Smoking status: Never Smoker  . Smokeless tobacco: Never Used  Vaping Use  . Vaping Use:  Never used  Substance and Sexual Activity  . Alcohol use: Yes    Alcohol/week: 3.0 standard drinks    Types: 3 Standard drinks or equivalent per week    Comment: up to 3 per week  . Drug use: Never  . Sexual activity: Not on file  Other Topics Concern  . Not on file  Social History Narrative   Lives at home alone   Right handed   Caffeine: 1 soda or tea per day     Social Determinants of Health   Financial Resource Strain:   . Difficulty of Paying Living Expenses:   Food Insecurity:   . Worried About Charity fundraiser in the Last Year:   . Arboriculturist in the Last Year:   Transportation Needs:   . Film/video editor (Medical):   Marland Kitchen Lack of Transportation (Non-Medical):   Physical Activity:   . Days of Exercise per Week:   . Minutes of Exercise per Session:   Stress:   . Feeling of Stress :   Social Connections:   . Frequency of Communication with Friends and Family:   . Frequency of Social Gatherings with Friends and Family:   . Attends Religious Services:   . Active Member of Clubs or Organizations:   . Attends Archivist Meetings:   Marland Kitchen Marital Status:   Intimate Partner Violence:   . Fear of Current or Ex-Partner:   . Emotionally Abused:   Marland Kitchen Physically Abused:   . Sexually Abused:     Family History  Problem Relation Age of Onset  . Diabetes Paternal Grandfather   . Breast cancer Maternal Grandmother   . Cervical cancer Maternal Grandmother   . Ovarian cancer Maternal Grandmother   . Diabetes Maternal Grandfather   . Heart disease Father   . Heart attack Father   . Hypertension Father   . Diabetes Mother   . Hypertension Mother   . Hyperlipidemia Mother   . Breast cancer Mother   . Heart disease Mother   . Heart disease Paternal Uncle   . Heart disease Paternal Uncle   . Diabetes Paternal Uncle   . Heart disease Paternal Uncle   . Cancer Paternal Uncle        pancreatic  . Heart disease Paternal Uncle   . Diabetes Paternal Uncle   . Cancer Paternal Grandmother   . Breast cancer Maternal Aunt   . Migraines Sister   . Colon cancer Neg Hx     Past Medical History:  Diagnosis Date  . Abdominal pain   . Acute biliary pancreatitis   . Depression   . Dysmenorrhea   . Endometriosis   . Hernia, hiatal   . HPV in female   . IBS (irritable bowel syndrome)   . IC (interstitial cystitis)   .  Migraine   . Ovarian cyst    resolved    Patient Active Problem List   Diagnosis Date Noted  . Chronic migraine without aura without status migrainosus, not intractable 10/30/2019  . Dysmenorrhea 08/02/2011  . Endometriosis 08/02/2011  . Infertility, female 08/02/2011  . Hidradenitis 08/02/2011  . Obesity 08/02/2011    Past Surgical History:  Procedure Laterality Date  . BLADDER SURGERY  2005  . CHOLECYSTECTOMY  2016  . CYSTOSCOPY    . ERCP  2019  . EXPLORATORY LAPAROTOMY  4/12   endometrosis  . NASAL ENDOSCOPY    . WISDOM TOOTH EXTRACTION      Current Outpatient  Medications  Medication Sig Dispense Refill  . Meth-Hyo-M Bl-Na Phos-Ph Sal (URIBEL) 118 MG CAPS TAKE 1 CAPSULE BY MOUTH EVERY DAY AS NEEDED    . Multiple Vitamin (MULTIVITAMIN PO) Take by mouth daily.    . norethindrone (MICRONOR,CAMILA,ERRIN) 0.35 MG tablet Take by mouth.    . Fremanezumab-vfrm (AJOVY) 225 MG/1.5ML SOAJ Inject 225 mg into the skin every 30 (thirty) days. 3 pen 11  . methylPREDNISolone (MEDROL DOSEPAK) 4 MG TBPK tablet Take all pills daily in the morning with food for 6 days. 21 tablet 1  . ondansetron (ZOFRAN-ODT) 4 MG disintegrating tablet Take 1 tablet (4 mg total) by mouth every 8 (eight) hours as needed for nausea. 30 tablet 3  . rizatriptan (MAXALT-MLT) 10 MG disintegrating tablet Take 1 tablet (10 mg total) by mouth as needed for migraine. May repeat in 2 hours if needed 9 tablet 11   Current Facility-Administered Medications  Medication Dose Route Frequency Provider Last Rate Last Admin  . ketorolac (TORADOL) injection 60 mg  60 mg Intramuscular Once Melvenia Beam, MD        Allergies as of 10/30/2019 - Review Complete 10/30/2019  Allergen Reaction Noted  . Amoxicillin-pot clavulanate Hives 03/15/2011  . Sulfa antibiotics Hives 03/15/2011  . Latex Rash 09/27/2012    Vitals: BP 108/70 (BP Location: Right Arm, Patient Position: Sitting)   Pulse 73   Ht 5\' 6"  (1.676 m)   Wt 198  lb (89.8 kg)   LMP 10/23/2019 (Exact Date)   BMI 31.96 kg/m  Last Weight:  Wt Readings from Last 1 Encounters:  10/30/19 198 lb (89.8 kg)   Last Height:   Ht Readings from Last 1 Encounters:  10/30/19 5\' 6"  (1.676 m)     Physical exam: Exam: Gen: NAD, conversant, well nourised,  well groomed                     CV: RRR, no MRG. No Carotid Bruits. No peripheral edema, warm, nontender Eyes: Conjunctivae clear without exudates or hemorrhage  Neuro: Detailed Neurologic Exam  Speech:    Speech is normal; fluent and spontaneous with normal comprehension.  Cognition:    The patient is oriented to person, place, and time;     recent and remote memory intact;     language fluent;     normal attention, concentration,     fund of knowledge Cranial Nerves:    The pupils are equal, round, and reactive to light. Could not visualize fundi due to photophobia. . Visual fields are full to finger confrontation. Extraocular movements are intact. Trigeminal sensation is intact and the muscles of mastication are normal. The face is symmetric. The palate elevates in the midline. Hearing intact. Voice is normal. Shoulder shrug is normal. The tongue has normal motion without fasciculations.   Coordination:    Normal finger to nose and heel to shin  Gait:    Normal native gait   Motor Observation:    No asymmetry, no atrophy, and no involuntary movements noted. Tone:    Normal muscle tone.    Posture:    Posture is normal. normal erect    Strength:    Strength is V/V in the upper and lower limbs.      Sensation: intact to LT     Reflex Exam:  DTR's:    Deep tendon reflexes in the upper and lower extremities are normal bilaterally.   Toes:    The toes are downgoing bilaterally.  Clonus:    Clonus is absent.    Assessment/Plan:  34 year old with Chronic Migraines however given concerning symptoms needs thorough evaluation  She is not sleeping well, she is snoring, she gets tired  during the day, she wakes with morning headaches as well as nocturnal headache. If not improved with treatment consider sleep study.   MRI of the brain w/wo contrast: MRI brain due to concerning symptoms of morning headaches, positional headaches,vision changes  to look for space occupying mass, chiari or intracranial hypertension (pseudotumor).  - MRI of the brain w/wo contrast - Toradol shot today - Inject Ajovy today - Ajovy monthly as preventative - Acute: Rizatriptan and Ondansetron: Please take one tablet at the onset of your headache. If it does not improve the symptoms please take one additional tablet. Do not take more then 2 tablets in 24hrs. Do not take use more then 2 to 3 times in a week. - Medrol dosepak for 6 days to help get you through  Discussed: To prevent or relieve headaches, try the following: Cool Compress. Lie down and place a cool compress on your head.  Avoid headache triggers. If certain foods or odors seem to have triggered your migraines in the past, avoid them. A headache diary might help you identify triggers.  Include physical activity in your daily routine. Try a daily walk or other moderate aerobic exercise.  Manage stress. Find healthy ways to cope with the stressors, such as delegating tasks on your to-do list.  Practice relaxation techniques. Try deep breathing, yoga, massage and visualization.  Eat regularly. Eating regularly scheduled meals and maintaining a healthy diet might help prevent headaches. Also, drink plenty of fluids.  Follow a regular sleep schedule. Sleep deprivation might contribute to headaches Consider biofeedback. With this mind-body technique, you learn to control certain bodily functions -- such as muscle tension, heart rate and blood pressure -- to prevent headaches or reduce headache pain.    Proceed to emergency room if you experience new or worsening symptoms or symptoms do not resolve, if you have new neurologic symptoms or if  headache is severe, or for any concerning symptom.   Provided education and documentation from American headache Society toolbox including articles on: chronic migraine medication overuse headache, chronic migraines, prevention of migraines, behavioral and other nonpharmacologic treatments for headache.   Orders Placed This Encounter  Procedures  . MR BRAIN W WO CONTRAST  . Comprehensive metabolic panel  . TSH  . CBC with Differential/Platelets   Meds ordered this encounter  Medications  . Fremanezumab-vfrm (AJOVY) 225 MG/1.5ML SOAJ    Sig: Inject 225 mg into the skin every 30 (thirty) days.    Dispense:  3 pen    Refill:  11    Patient has copay card; she can have medication for $5 regardless of insurance approval or copay amount.  . rizatriptan (MAXALT-MLT) 10 MG disintegrating tablet    Sig: Take 1 tablet (10 mg total) by mouth as needed for migraine. May repeat in 2 hours if needed    Dispense:  9 tablet    Refill:  11  . ondansetron (ZOFRAN-ODT) 4 MG disintegrating tablet    Sig: Take 1 tablet (4 mg total) by mouth every 8 (eight) hours as needed for nausea.    Dispense:  30 tablet    Refill:  3  . methylPREDNISolone (MEDROL DOSEPAK) 4 MG TBPK tablet    Sig: Take all pills daily in the morning with food for 6 days.  Dispense:  21 tablet    Refill:  1  . ketorolac (TORADOL) injection 60 mg    Cc: Aloha Gell, MD,  Aloha Gell, MD  Sarina Ill, MD  [facility] [address] [place], [place] [address]  Phone [phone] Fax [phone]

## 2019-10-30 ENCOUNTER — Other Ambulatory Visit: Payer: Self-pay

## 2019-10-30 ENCOUNTER — Ambulatory Visit: Payer: BC Managed Care – PPO | Admitting: Neurology

## 2019-10-30 ENCOUNTER — Encounter: Payer: Self-pay | Admitting: Neurology

## 2019-10-30 VITALS — BP 108/70 | HR 73 | Ht 66.0 in | Wt 198.0 lb

## 2019-10-30 DIAGNOSIS — R51 Headache with orthostatic component, not elsewhere classified: Secondary | ICD-10-CM | POA: Diagnosis not present

## 2019-10-30 DIAGNOSIS — G43709 Chronic migraine without aura, not intractable, without status migrainosus: Secondary | ICD-10-CM

## 2019-10-30 DIAGNOSIS — H539 Unspecified visual disturbance: Secondary | ICD-10-CM | POA: Diagnosis not present

## 2019-10-30 DIAGNOSIS — R519 Headache, unspecified: Secondary | ICD-10-CM | POA: Diagnosis not present

## 2019-10-30 DIAGNOSIS — H538 Other visual disturbances: Secondary | ICD-10-CM

## 2019-10-30 MED ORDER — KETOROLAC TROMETHAMINE 60 MG/2ML IM SOLN
60.0000 mg | Freq: Once | INTRAMUSCULAR | Status: AC
  Administered 2019-10-30: 60 mg via INTRAMUSCULAR

## 2019-10-30 MED ORDER — RIZATRIPTAN BENZOATE 10 MG PO TBDP: 10.0000 mg | ORAL_TABLET | ORAL | 11 refills | Status: DC | PRN

## 2019-10-30 MED ORDER — AJOVY 225 MG/1.5ML ~~LOC~~ SOAJ: 225.0000 mg | Freq: Once | SUBCUTANEOUS | 0 refills | Status: AC

## 2019-10-30 MED ORDER — ONDANSETRON 4 MG PO TBDP: 4.0000 mg | ORAL_TABLET | Freq: Three times a day (TID) | ORAL | 3 refills | Status: DC | PRN

## 2019-10-30 MED ORDER — AJOVY 225 MG/1.5ML ~~LOC~~ SOAJ: 225.0000 mg | SUBCUTANEOUS | 11 refills | Status: DC

## 2019-10-30 MED ORDER — METHYLPREDNISOLONE 4 MG PO TBPK: ORAL_TABLET | ORAL | 1 refills | Status: DC

## 2019-10-30 NOTE — Progress Notes (Signed)
Under aseptic technique administered, per v.o. Dr. Jaynee Eagles, Toradol 60 mg IM in RUOQ of buttock and Ajovy 225 mg SQ in RUQ of abdomen. Pt understands the administration instructions for Ajovy and the options for injection sites to rotate through every 30 days. Pt was given the administration instructions and Ajovy drug information for review. She tolerated the injections well. She has a savings card to use for the Ajovy which will work regardless of insurance denial/approval. Her questions were answered.

## 2019-10-30 NOTE — Patient Instructions (Addendum)
- MRI of the brain w/wo contrast - Toradol shot today - Inject Ajovy today - Ajovy monthly as preventative - Acute: Rizatriptan and Ondansetron: Please take one tablet at the onset of your headache. If it does not improve the symptoms please take one additional tablet. Do not take more then 2 tablets in 24hrs. Do not take use more then 2 to 3 times in a week. - Medrol dosepak for 6 days to help get you through  Methylprednisolone tablets What is this medicine? METHYLPREDNISOLONE (meth ill pred NISS oh lone) is a corticosteroid. It is commonly used to treat inflammation of the skin, joints, lungs, and other organs. Common conditions treated include asthma, allergies, and arthritis. It is also used for other conditions, such as blood disorders and diseases of the adrenal glands. This medicine may be used for other purposes; ask your health care provider or pharmacist if you have questions. COMMON BRAND NAME(S): Medrol, Medrol Dosepak What should I tell my health care provider before I take this medicine? They need to know if you have any of these conditions:  Cushing's syndrome  eye disease, vision problems  diabetes  glaucoma  heart disease  high blood pressure  infection (especially a virus infection such as chickenpox, cold sores, or herpes)  liver disease  mental illness  myasthenia gravis  osteoporosis  recently received or scheduled to receive a vaccine  seizures  stomach or intestine problems  thyroid disease  an unusual or allergic reaction to lactose, methylprednisolone, other medicines, foods, dyes, or preservatives  pregnant or trying to get pregnant  breast-feeding How should I use this medicine? Take this medicine by mouth with a glass of water. Follow the directions on the prescription label. Take this medicine with food. If you are taking this medicine once a day, take it in the morning. Do not take it more often than directed. Do not suddenly stop  taking your medicine because you may develop a severe reaction. Your doctor will tell you how much medicine to take. If your doctor wants you to stop the medicine, the dose may be slowly lowered over time to avoid any side effects. Talk to your pediatrician regarding the use of this medicine in children. Special care may be needed. Overdosage: If you think you have taken too much of this medicine contact a poison control center or emergency room at once. NOTE: This medicine is only for you. Do not share this medicine with others. What if I miss a dose? If you miss a dose, take it as soon as you can. If it is almost time for your next dose, talk to your doctor or health care professional. You may need to miss a dose or take an extra dose. Do not take double or extra doses without advice. What may interact with this medicine? Do not take this medicine with any of the following medications:  alefacept  echinacea  live virus vaccines  metyrapone  mifepristone This medicine may also interact with the following medications:  amphotericin B  aspirin and aspirin-like medicines  certain antibiotics like erythromycin, clarithromycin, troleandomycin  certain medicines for diabetes  certain medicines for fungal infections like ketoconazole  certain medicines for seizures like carbamazepine, phenobarbital, phenytoin  certain medicines that treat or prevent blood clots like warfarin  cholestyramine  cyclosporine  digoxin  diuretics  female hormones, like estrogens and birth control pills  isoniazid  NSAIDs, medicines for pain inflammation, like ibuprofen or naproxen  other medicines for myasthenia gravis  rifampin  vaccines This list may not describe all possible interactions. Give your health care provider a list of all the medicines, herbs, non-prescription drugs, or dietary supplements you use. Also tell them if you smoke, drink alcohol, or use illegal drugs. Some items may  interact with your medicine. What should I watch for while using this medicine? Tell your doctor or healthcare professional if your symptoms do not start to get better or if they get worse. Do not stop taking except on your doctor's advice. You may develop a severe reaction. Your doctor will tell you how much medicine to take. This medicine may increase your risk of getting an infection. Tell your doctor or health care professional if you are around anyone with measles or chickenpox, or if you develop sores or blisters that do not heal properly. This medicine may increase blood sugar levels. Ask your healthcare provider if changes in diet or medicines are needed if you have diabetes. Tell your doctor or health care professional right away if you have any change in your eyesight. Using this medicine for a long time may increase your risk of low bone mass. Talk to your doctor about bone health. What side effects may I notice from receiving this medicine? Side effects that you should report to your doctor or health care professional as soon as possible:  allergic reactions like skin rash, itching or hives, swelling of the face, lips, or tongue  bloody or tarry stools  hallucination, loss of contact with reality  muscle cramps  muscle pain  palpitations  signs and symptoms of high blood sugar such as being more thirsty or hungry or having to urinate more than normal. You may also feel very tired or have blurry vision.  signs and symptoms of infection like fever or chills; cough; sore throat; pain or trouble passing urine Side effects that usually do not require medical attention (report to your doctor or health care professional if they continue or are bothersome):  changes in emotions or mood  constipation  diarrhea  excessive hair growth on the face or body  headache  nausea, vomiting  trouble sleeping  weight gain This list may not describe all possible side effects. Call  your doctor for medical advice about side effects. You may report side effects to FDA at 1-800-FDA-1088. Where should I keep my medicine? Keep out of the reach of children. Store at room temperature between 20 and 25 degrees C (68 and 77 degrees F). Throw away any unused medicine after the expiration date. NOTE: This sheet is a summary. It may not cover all possible information. If you have questions about this medicine, talk to your doctor, pharmacist, or health care provider.  2020 Elsevier/Gold Standard (2017-12-21 09:19:36)   Ondansetron oral dissolving tablet What is this medicine? ONDANSETRON (on DAN se tron) is used to treat nausea and vomiting caused by chemotherapy. It is also used to prevent or treat nausea and vomiting after surgery. This medicine may be used for other purposes; ask your health care provider or pharmacist if you have questions. COMMON BRAND NAME(S): Zofran ODT What should I tell my health care provider before I take this medicine? They need to know if you have any of these conditions:  heart disease  history of irregular heartbeat  liver disease  low levels of magnesium or potassium in the blood  an unusual or allergic reaction to ondansetron, granisetron, other medicines, foods, dyes, or preservatives  pregnant or trying to get pregnant  breast-feeding How should I use this medicine? These tablets are made to dissolve in the mouth. Do not try to push the tablet through the foil backing. With dry hands, peel away the foil backing and gently remove the tablet. Place the tablet in the mouth and allow it to dissolve, then swallow. While you may take these tablets with water, it is not necessary to do so. Talk to your pediatrician regarding the use of this medicine in children. Special care may be needed. Overdosage: If you think you have taken too much of this medicine contact a poison control center or emergency room at once. NOTE: This medicine is only for  you. Do not share this medicine with others. What if I miss a dose? If you miss a dose, take it as soon as you can. If it is almost time for your next dose, take only that dose. Do not take double or extra doses. What may interact with this medicine? Do not take this medicine with any of the following medications:  apomorphine  certain medicines for fungal infections like fluconazole, itraconazole, ketoconazole, posaconazole, voriconazole  cisapride  dronedarone  pimozide  thioridazine This medicine may also interact with the following medications:  carbamazepine  certain medicines for depression, anxiety, or psychotic disturbances  fentanyl  linezolid  MAOIs like Carbex, Eldepryl, Marplan, Nardil, and Parnate  methylene blue (injected into a vein)  other medicines that prolong the QT interval (cause an abnormal heart rhythm) like dofetilide, ziprasidone  phenytoin  rifampicin  tramadol This list may not describe all possible interactions. Give your health care provider a list of all the medicines, herbs, non-prescription drugs, or dietary supplements you use. Also tell them if you smoke, drink alcohol, or use illegal drugs. Some items may interact with your medicine. What should I watch for while using this medicine? Check with your doctor or health care professional as soon as you can if you have any sign of an allergic reaction. What side effects may I notice from receiving this medicine? Side effects that you should report to your doctor or health care professional as soon as possible:  allergic reactions like skin rash, itching or hives, swelling of the face, lips, or tongue  breathing problems  confusion  dizziness  fast or irregular heartbeat  feeling faint or lightheaded, falls  fever and chills  loss of balance or coordination  seizures  sweating  swelling of the hands and feet  tightness in the chest  tremors  unusually weak or  tired Side effects that usually do not require medical attention (report to your doctor or health care professional if they continue or are bothersome):  constipation or diarrhea  headache This list may not describe all possible side effects. Call your doctor for medical advice about side effects. You may report side effects to FDA at 1-800-FDA-1088. Where should I keep my medicine? Keep out of the reach of children. Store between 2 and 30 degrees C (36 and 86 degrees F). Throw away any unused medicine after the expiration date. NOTE: This sheet is a summary. It may not cover all possible information. If you have questions about this medicine, talk to your doctor, pharmacist, or health care provider.  2020 Elsevier/Gold Standard (2018-03-13 07:14:10) Rizatriptan disintegrating tablets What is this medicine? RIZATRIPTAN (rye za TRIP tan) is used to treat migraines with or without aura. An aura is a strange feeling or visual disturbance that warns you of an attack. It is not used to prevent  migraines. This medicine may be used for other purposes; ask your health care provider or pharmacist if you have questions. COMMON BRAND NAME(S): Maxalt-MLT What should I tell my health care provider before I take this medicine? They need to know if you have any of these conditions:  cigarette smoker  circulation problems in fingers and toes  diabetes  heart disease  high blood pressure  high cholesterol  history of irregular heartbeat  history of stroke  kidney disease  liver disease  stomach or intestine problems  an unusual or allergic reaction to rizatriptan, other medicines, foods, dyes, or preservatives  pregnant or trying to get pregnant  breast-feeding How should I use this medicine? Take this medicine by mouth. Follow the directions on the prescription label. Leave the tablet in the sealed blister pack until you are ready to take it. With dry hands, open the blister and  gently remove the tablet. If the tablet breaks or crumbles, throw it away and take a new tablet out of the blister pack. Place the tablet in the mouth and allow it to dissolve, and then swallow. Do not cut, crush, or chew this medicine. You do not need water to take this medicine. Do not take it more often than directed. Talk to your pediatrician regarding the use of this medicine in children. While this drug may be prescribed for children as young as 6 years for selected conditions, precautions do apply. Overdosage: If you think you have taken too much of this medicine contact a poison control center or emergency room at once. NOTE: This medicine is only for you. Do not share this medicine with others. What if I miss a dose? This does not apply. This medicine is not for regular use. What may interact with this medicine? Do not take this medicine with any of the following medicines:  certain medicines for migraine headache like almotriptan, eletriptan, frovatriptan, naratriptan, rizatriptan, sumatriptan, zolmitriptan  ergot alkaloids like dihydroergotamine, ergonovine, ergotamine, methylergonovine  MAOIs like Carbex, Eldepryl, Marplan, Nardil, and Parnate This medicine may also interact with the following medications:  certain medicines for depression, anxiety, or psychotic disorders  propranolol This list may not describe all possible interactions. Give your health care provider a list of all the medicines, herbs, non-prescription drugs, or dietary supplements you use. Also tell them if you smoke, drink alcohol, or use illegal drugs. Some items may interact with your medicine. What should I watch for while using this medicine? Visit your healthcare professional for regular checks on your progress. Tell your healthcare professional if your symptoms do not start to get better or if they get worse. You may get drowsy or dizzy. Do not drive, use machinery, or do anything that needs mental  alertness until you know how this medicine affects you. Do not stand up or sit up quickly, especially if you are an older patient. This reduces the risk of dizzy or fainting spells. Alcohol may interfere with the effect of this medicine. Your mouth may get dry. Chewing sugarless gum or sucking hard candy and drinking plenty of water may help. Contact your healthcare professional if the problem does not go away or is severe. If you take migraine medicines for 10 or more days a month, your migraines may get worse. Keep a diary of headache days and medicine use. Contact your healthcare professional if your migraine attacks occur more frequently. What side effects may I notice from receiving this medicine? Side effects that you should report to your doctor  or health care professional as soon as possible:  allergic reactions like skin rash, itching or hives, swelling of the face, lips, or tongue  chest pain or chest tightness  signs and symptoms of a dangerous change in heartbeat or heart rhythm like chest pain; dizziness; fast, irregular heartbeat; palpitations; feeling faint or lightheaded; falls; breathing problems  signs and symptoms of a stroke like changes in vision; confusion; trouble speaking or understanding; severe headaches; sudden numbness or weakness of the face, arm or leg; trouble walking; dizziness; loss of balance or coordination  signs and symptoms of serotonin syndrome like irritable; confusion; diarrhea; fast or irregular heartbeat; muscle twitching; stiff muscles; trouble walking; sweating; high fever; seizures; chills; vomiting Side effects that usually do not require medical attention (report to your doctor or health care professional if they continue or are bothersome):  diarrhea  dizziness  drowsiness  dry mouth  headache  nausea, vomiting  pain, tingling, numbness in the hands or feet  stomach pain This list may not describe all possible side effects. Call your  doctor for medical advice about side effects. You may report side effects to FDA at 1-800-FDA-1088. Where should I keep my medicine? Keep out of the reach of children. Store at room temperature between 15 and 30 degrees C (59 and 86 degrees F). Protect from light and moisture. Throw away any unused medicine after the expiration date. NOTE: This sheet is a summary. It may not cover all possible information. If you have questions about this medicine, talk to your doctor, pharmacist, or health care provider.  2020 Elsevier/Gold Standard (2017-10-03 14:58:08) Ketorolac injection What is this medicine? KETOROLAC (kee toe ROLE ak) is a non-steroidal anti-inflammatory drug (NSAID). It is used to treat moderate to severe pain for up to 5 days. It is commonly used after surgery. This medicine should not be used for more than 5 days. This medicine may be used for other purposes; ask your health care provider or pharmacist if you have questions. COMMON BRAND NAME(S): Toradol What should I tell my health care provider before I take this medicine? They need to know if you have any of these conditions:  asthma, especially aspirin-sensitive asthma  bleeding problems  coronary artery bypass graft (CABG) surgery within the past 2 weeks  kidney disease  stomach bleed, ulcer, or other problem  taking aspirin, other NSAID, or probenecid  an unusual or allergic reaction to ketorolac, tromethamine, aspirin, other NSAIDs, other medicines, foods, dyes or preservatives  pregnant or trying to get pregnant  breast-feeding How should I use this medicine? This medicine is for injection into a muscle or into a vein. It is given by a health care professional in a hospital or clinic setting. Talk to your pediatrician regarding the use of this medicine in children. Special care may be needed. Patients over 9 years old may have a stronger reaction and need a smaller dose. Overdosage: If you think you have taken  too much of this medicine contact a poison control center or emergency room at once. NOTE: This medicine is only for you. Do not share this medicine with others. What if I miss a dose? This does not apply. What may interact with this medicine? Do not take this medicine with any of the following medications:  aspirin and aspirin-like medicines  cidofovir  methotrexate  NSAIDs, medicines for pain and inflammation, like ibuprofen or naproxen  pentoxifylline  probenecid This medicine may also interact with the following medications:  alcohol  alendronate  alprazolam  carbamazepine  diuretics  flavocoxid  fluoxetine  ginkgo  lithium  medicines for blood pressure like enalapril  medicines that affect platelets like pentoxifylline  medicines that treat or prevent blood clots like heparin, warfarin  muscle relaxants  pemetrexed  phenytoin  thiothixene This list may not describe all possible interactions. Give your health care provider a list of all the medicines, herbs, non-prescription drugs, or dietary supplements you use. Also tell them if you smoke, drink alcohol, or use illegal drugs. Some items may interact with your medicine. What should I watch for while using this medicine? Tell your doctor or healthcare provider if your symptoms do not start to get better or if they get worse. This medicine may cause serious skin reactions. They can happen weeks to months after starting the medicine. Contact your healthcare provider right away if you notice fevers or flu-like symptoms with a rash. The rash may be red or purple and then turn into blisters or peeling of the skin. Or, you might notice a red rash with swelling of the face, lips or lymph nodes in your neck or under your arms. This medicine does not prevent heart attack or stroke. In fact, this medicine may increase the chance of a heart attack or stroke. The chance may increase with longer use of this medicine and  in people who have heart disease. If you take aspirin to prevent heart attack or stroke, talk with your doctor or healthcare provider. Do not take medicines such as ibuprofen and naproxen with this medicine. Side effects such as stomach upset, nausea, or ulcers may be more likely to occur. Many medicines available without a prescription should not be taken with this medicine. This medicine can cause ulcers and bleeding in the stomach and intestines at any time during treatment. Do not smoke cigarettes or drink alcohol. These increase irritation to your stomach and can make it more susceptible to damage from this medicine. Ulcers and bleeding can happen without warning symptoms and can cause death. This medicine can cause you to bleed more easily. Try to avoid damage to your teeth and gums when you brush or floss your teeth. What side effects may I notice from receiving this medicine? Side effects that you should report to your doctor or health care professional as soon as possible:  allergic reactions like skin rash, itching or hives, swelling of the face, lips, or tongue  breathing problems  high blood pressure  nausea, vomiting  redness, blistering, peeling or loosening of the skin, including inside the mouth  severe stomach pain  signs and symptoms of bleeding such as bloody or black, tarry stools; red or dark-brown urine; spitting up blood or brown material that looks like coffee grounds; red spots on the skin; unusual bruising or bleeding from the eye, gums, or nose  signs and symptoms of a blood clot changes in vision; chest pain; severe, sudden headache; trouble speaking; sudden numbness or weakness of the face, arm, or leg  trouble passing urine or change in the amount of urine  unexplained weight gain or swelling  unusually weak or tired  yellowing of eyes or skin Side effects that usually do not require medical attention (report to your doctor or health care professional if  they continue or are bothersome):  diarrhea  dizziness  headache  heartburn This list may not describe all possible side effects. Call your doctor for medical advice about side effects. You may report side effects to FDA at 1-800-FDA-1088. Where  should I keep my medicine? This drug is given in a hospital or clinic and will not be stored at home. NOTE: This sheet is a summary. It may not cover all possible information. If you have questions about this medicine, talk to your doctor, pharmacist, or health care provider.  2020 Elsevier/Gold Standard (2018-06-06 15:10:53) Rolanda Lundborg injection What is this medicine? FREMANEZUMAB (fre ma NEZ ue mab) is used to prevent migraine headaches. This medicine may be used for other purposes; ask your health care provider or pharmacist if you have questions. COMMON BRAND NAME(S): AJOVY What should I tell my health care provider before I take this medicine? They need to know if you have any of these conditions:  an unusual or allergic reaction to fremanezumab, other medicines, foods, dyes, or preservatives  pregnant or trying to get pregnant  breast-feeding How should I use this medicine? This medicine is for injection under the skin. You will be taught how to prepare and give this medicine. Use exactly as directed. Take your medicine at regular intervals. Do not take your medicine more often than directed. It is important that you put your used needles and syringes in a special sharps container. Do not put them in a trash can. If you do not have a sharps container, call your pharmacist or healthcare provider to get one. Talk to your pediatrician regarding the use of this medicine in children. Special care may be needed. Overdosage: If you think you have taken too much of this medicine contact a poison control center or emergency room at once. NOTE: This medicine is only for you. Do not share this medicine with others. What if I miss a dose? If  you miss a dose, take it as soon as you can. If it is almost time for your next dose, take only that dose. Do not take double or extra doses. What may interact with this medicine? Interactions are not expected. This list may not describe all possible interactions. Give your health care provider a list of all the medicines, herbs, non-prescription drugs, or dietary supplements you use. Also tell them if you smoke, drink alcohol, or use illegal drugs. Some items may interact with your medicine. What should I watch for while using this medicine? Tell your doctor or healthcare professional if your symptoms do not start to get better or if they get worse. What side effects may I notice from receiving this medicine? Side effects that you should report to your doctor or health care professional as soon as possible:  allergic reactions like skin rash, itching or hives, swelling of the face, lips, or tongue Side effects that usually do not require medical attention (report these to your doctor or health care professional if they continue or are bothersome):  pain, redness, or irritation at site where injected This list may not describe all possible side effects. Call your doctor for medical advice about side effects. You may report side effects to FDA at 1-800-FDA-1088. Where should I keep my medicine? Keep out of the reach of children. You will be instructed on how to store this medicine. Throw away any unused medicine after the expiration date on the label. NOTE: This sheet is a summary. It may not cover all possible information. If you have questions about this medicine, talk to your doctor, pharmacist, or health care provider.  2020 Elsevier/Gold Standard (2016-12-19 17:22:56)

## 2019-10-30 NOTE — Addendum Note (Signed)
Addended by: Gildardo Griffes on: 10/30/2019 09:27 AM   Modules accepted: Orders

## 2019-10-31 ENCOUNTER — Telehealth: Payer: Self-pay | Admitting: Neurology

## 2019-10-31 ENCOUNTER — Telehealth: Payer: Self-pay | Admitting: *Deleted

## 2019-10-31 LAB — CBC WITH DIFFERENTIAL/PLATELET
Basophils Absolute: 0 10*3/uL (ref 0.0–0.2)
Basos: 0 %
EOS (ABSOLUTE): 0.1 10*3/uL (ref 0.0–0.4)
Eos: 1 %
Hematocrit: 42.8 % (ref 34.0–46.6)
Hemoglobin: 14.3 g/dL (ref 11.1–15.9)
Immature Grans (Abs): 0 10*3/uL (ref 0.0–0.1)
Immature Granulocytes: 0 %
Lymphocytes Absolute: 2 10*3/uL (ref 0.7–3.1)
Lymphs: 28 %
MCH: 31 pg (ref 26.6–33.0)
MCHC: 33.4 g/dL (ref 31.5–35.7)
MCV: 93 fL (ref 79–97)
Monocytes Absolute: 0.7 10*3/uL (ref 0.1–0.9)
Monocytes: 10 %
Neutrophils Absolute: 4.4 10*3/uL (ref 1.4–7.0)
Neutrophils: 61 %
Platelets: 297 10*3/uL (ref 150–450)
RBC: 4.62 x10E6/uL (ref 3.77–5.28)
RDW: 12.2 % (ref 11.7–15.4)
WBC: 7.3 10*3/uL (ref 3.4–10.8)

## 2019-10-31 LAB — COMPREHENSIVE METABOLIC PANEL
ALT: 22 IU/L (ref 0–32)
AST: 18 IU/L (ref 0–40)
Albumin/Globulin Ratio: 1.8 (ref 1.2–2.2)
Albumin: 4.2 g/dL (ref 3.8–4.8)
Alkaline Phosphatase: 77 IU/L (ref 48–121)
BUN/Creatinine Ratio: 15 (ref 9–23)
BUN: 10 mg/dL (ref 6–20)
Bilirubin Total: 0.3 mg/dL (ref 0.0–1.2)
CO2: 21 mmol/L (ref 20–29)
Calcium: 8.9 mg/dL (ref 8.7–10.2)
Chloride: 106 mmol/L (ref 96–106)
Creatinine, Ser: 0.66 mg/dL (ref 0.57–1.00)
GFR calc Af Amer: 131 mL/min/{1.73_m2} (ref >59)
GFR calc non Af Amer: 114 mL/min/{1.73_m2} (ref >59)
Globulin, Total: 2.4 g/dL (ref 1.5–4.5)
Glucose: 87 mg/dL (ref 65–99)
Potassium: 4.1 mmol/L (ref 3.5–5.2)
Sodium: 140 mmol/L (ref 134–144)
Total Protein: 6.6 g/dL (ref 6.0–8.5)

## 2019-10-31 LAB — TSH: TSH: 1.12 u[IU]/mL (ref 0.450–4.500)

## 2019-10-31 NOTE — Telephone Encounter (Signed)
Attempted [employer] PA on CMM, stated no eligibility found. Called [facility], spoke with pharmacist who gave same insurance information on file in her EMR. Called [facility], spoke with rep who was unable to find an active policy on patient. Called patient who stated he new card has [place] as last name, not [place]. I advised she bring new card to be scanned into her EMR. She agreed. [employer] PA on CMM, key: [id number], G43.709, R51.9, failed: topamax, tylenol, ibuprofen, amitriptyline, ketoralac inj, zofran, imitrex, tramadol, cannot take verapamil and similar.  Your information has been submitted to [employer].  If [employer] has not responded to your request within 24 hours, contact [place] at [phone].

## 2019-10-31 NOTE — Telephone Encounter (Signed)
Called the pt & LVM asking for call back. When she calls back, please let her know her labs are normal.

## 2019-10-31 NOTE — Telephone Encounter (Signed)
Pt returned phone call from nurse, inform Pt of Lab results.

## 2019-10-31 NOTE — Telephone Encounter (Signed)
-----   Message from Melvenia Beam, MD sent at 10/31/2019  7:28 AM EDT ----- Labs normal thanks

## 2019-10-31 NOTE — Telephone Encounter (Signed)
no to the covid questions MR Brain w/wo contrast Dr. Ihor Dow Josem Kaufmann: [id number] (exp. 10/31/19 to 04/27/20). Patient is scheduled at [facility] for 11/06/19.

## 2019-10-31 NOTE — Telephone Encounter (Signed)
error 

## 2019-11-04 NOTE — Telephone Encounter (Signed)
[employer] approved under name Toni Fields, until 01/31/2020. Approval letter faxed to [facility], [place].

## 2019-11-06 ENCOUNTER — Ambulatory Visit: Payer: BC Managed Care – PPO

## 2019-11-06 DIAGNOSIS — H539 Unspecified visual disturbance: Secondary | ICD-10-CM

## 2019-11-06 DIAGNOSIS — H538 Other visual disturbances: Secondary | ICD-10-CM | POA: Diagnosis not present

## 2019-11-06 DIAGNOSIS — R519 Headache, unspecified: Secondary | ICD-10-CM

## 2019-11-06 DIAGNOSIS — R51 Headache with orthostatic component, not elsewhere classified: Secondary | ICD-10-CM | POA: Diagnosis not present

## 2019-11-06 MED ORDER — GADOBENATE DIMEGLUMINE 529 MG/ML IV SOLN
20.0000 mL | Freq: Once | INTRAVENOUS | Status: AC | PRN
  Administered 2019-11-06: 20 mL via INTRAVENOUS

## 2019-11-07 ENCOUNTER — Telehealth: Payer: Self-pay | Admitting: *Deleted

## 2019-11-07 NOTE — Telephone Encounter (Signed)
-----   Message from Marcial Pacas, MD sent at 11/07/2019  2:03 PM EDT ----- Please call pt for normal MRI brain.

## 2019-11-07 NOTE — Telephone Encounter (Signed)
Spoke with pt and advised her MRI brain is normal. Pt very appreciative. F/u in November.

## 2019-12-11 ENCOUNTER — Telehealth: Payer: Self-pay | Admitting: Neurology

## 2019-12-11 NOTE — Telephone Encounter (Signed)
Spoke with patient. She stated the chair that she current sits in drops down and she is pushed forward, resulting in pressure in her shoulder blades that go into her neck causing headaches. She requests lumbar support. She found a chair she believes will help and it is a Work Secretary/administrator w/ head rest support. I advised a  Message would be sent to Dr Jaynee Eagles to respond to when she is back in the office tomorrow. The pt verbalized appreciation.

## 2019-12-11 NOTE — Telephone Encounter (Signed)
Pt called, attempted to order a chair at work to help prevent migraines. Boss denied request for a chair that would help with migraines. Toni Fields said would need a note stating chair would be necessary to prevent migraines. Pt would like a call from the nurse.

## 2019-12-12 ENCOUNTER — Encounter: Payer: Self-pay | Admitting: *Deleted

## 2019-12-12 NOTE — Telephone Encounter (Signed)
Spoke with the pt and discussed Dr. Cathren Laine message. Pt was very appreciative and asked that the letter be faxed to her attention at [phone].   Letter written, signed, and faxed to pt. Received a receipt of confirmation.

## 2019-12-12 NOTE — Telephone Encounter (Signed)
We can recommend an ergonomic chair which may help with her neck pain and migraines but we cannot recommend a specific one. Please write a letter than an ergonomic chaor may be helpful for her medical conditions thanks

## 2020-03-02 ENCOUNTER — Ambulatory Visit: Payer: BC Managed Care – PPO | Admitting: Family Medicine

## 2020-03-02 ENCOUNTER — Encounter: Payer: Self-pay | Admitting: Family Medicine

## 2020-03-02 NOTE — Progress Notes (Deleted)
No chief complaint on file.    HISTORY OF PRESENT ILLNESS: Today 03/02/20  Toni Fields is a 34 y.o. female here today for follow up for migraines.    HISTORY (copied from previous note)  HPI:  Toni Fields is a 34 y.o. female here as requested by Aloha Gell, MD for chronic migraines. PMHx: migraine, IBS. I reviewed notes from the emergency department she was last seen there in July 2 of this year for migraine.  Patient went to the gym and worked out on around 6 PM she had a headache with associated light and sound sensitivity, she saw floaters in her vision and an episode of vomiting, pain radiating down to the right face and neck, no confusion or neck stiffness no head injury no weakness or tingling in the arms of legs and no treatments prior to arrival.  Symptoms were similar to previous migraines.  Physical and neurologic exam were normal.  She was treated with Reglan, Benadryl, ketorolac injections and a bolus of normal saline.  She was discharged feeling better.  Patient's been to the emergency room multiple times in 2009 had a lumbar puncture for migraine which showed normal CSF cell count with differential, protein and glucose.  Patient here and reports she has been having intense headaches, the last one hit her in her sleep, made her nauseated, lasted 2 to 3 days and she went to the hospital.  She was seen by Dr. Farley Ly around the ages of [age] for the headaches but they went away for almost a decade and now have come back.  They are more intense than before, Meds tried include steroids, Imitrex, Topamax, ibuprofen 800 mg. They start at night, it starts in the neck and travels to the head both sides, blurry vision, tingling hands.It starts as sharo stabbing pain in the occipital area, she is not sleeping well, she is snoring, she gets tired during the day, she wakes with morning headaches as well. She has never had headaches in the morning or wake her up, this is new, it started  again about a year ago started getting them again and worsening 4-5 months ago. They are positional worse standing, nausea, vomiting, light and sound sensitivity, moving makes it worse. They can last 48-72 hours and it is debilitating. Cold packs help, has to be in complete darkness and stillness,  Ibuprofen does not help, no medication overuse, She has 12 migraine days a month and daily headache. She cannot have kids. No aura.   Reviewed notes, labs and imaging from outside physicians, which showed:  Will request Dr. Tressia Danas notes  CT head 02/27/2008 showed No acute intracranial abnormalities including mass lesion or mass effect, hydrocephalus, extra-axial fluid collection, midline shift, hemorrhage, or acute infarction, large ischemic events (personally reviewed images)  meds tried: topamax (did not tolerate), tylenol, amitriptyline, ibuprofen, ketorolac injections, ketorolac inj,  zofran injection, imitrex, tramadol, BP meds (propranolol, verapanil) contraindicated due to hypotension.    REVIEW OF SYSTEMS: Out of a complete 14 system review of symptoms, the patient complains only of the following symptoms, and all other reviewed systems are negative.   ALLERGIES: Allergies  Allergen Reactions  . Amoxicillin-Pot Clavulanate Hives    Shortness of breath  . Sulfa Antibiotics Hives  . Latex Rash     HOME MEDICATIONS: Outpatient Medications Prior to Visit  Medication Sig Dispense Refill  . Fremanezumab-vfrm (AJOVY) 225 MG/1.5ML SOAJ Inject 225 mg into the skin every 30 (thirty) days. 3 pen 11  .  Meth-Hyo-M Bl-Na Phos-Ph Sal (URIBEL) 118 MG CAPS TAKE 1 CAPSULE BY MOUTH EVERY DAY AS NEEDED    . methylPREDNISolone (MEDROL DOSEPAK) 4 MG TBPK tablet Take all pills daily in the morning with food for 6 days. 21 tablet 1  . Multiple Vitamin (MULTIVITAMIN PO) Take by mouth daily.    . norethindrone (MICRONOR,CAMILA,ERRIN) 0.35 MG tablet Take by mouth.    . ondansetron (ZOFRAN-ODT) 4 MG  disintegrating tablet Take 1 tablet (4 mg total) by mouth every 8 (eight) hours as needed for nausea. 30 tablet 3  . rizatriptan (MAXALT-MLT) 10 MG disintegrating tablet Take 1 tablet (10 mg total) by mouth as needed for migraine. May repeat in 2 hours if needed 9 tablet 11   No facility-administered medications prior to visit.     PAST MEDICAL HISTORY: Past Medical History:  Diagnosis Date  . Abdominal pain   . Acute biliary pancreatitis   . Depression   . Dysmenorrhea   . Endometriosis   . Hernia, hiatal   . HPV in female   . IBS (irritable bowel syndrome)   . IC (interstitial cystitis)   . Migraine   . Ovarian cyst    resolved     PAST SURGICAL HISTORY: Past Surgical History:  Procedure Laterality Date  . BLADDER SURGERY  2005  . CHOLECYSTECTOMY  2016  . CYSTOSCOPY    . ERCP  2019  . EXPLORATORY LAPAROTOMY  4/12   endometrosis  . NASAL ENDOSCOPY    . WISDOM TOOTH EXTRACTION       FAMILY HISTORY: Family History  Problem Relation Age of Onset  . Diabetes Paternal Grandfather   . Breast cancer Maternal Grandmother   . Cervical cancer Maternal Grandmother   . Ovarian cancer Maternal Grandmother   . Diabetes Maternal Grandfather   . Heart disease Father   . Heart attack Father   . Hypertension Father   . Diabetes Mother   . Hypertension Mother   . Hyperlipidemia Mother   . Breast cancer Mother   . Heart disease Mother   . Heart disease Paternal Uncle   . Heart disease Paternal Uncle   . Diabetes Paternal Uncle   . Heart disease Paternal Uncle   . Cancer Paternal Uncle        pancreatic  . Heart disease Paternal Uncle   . Diabetes Paternal Uncle   . Cancer Paternal Grandmother   . Breast cancer Maternal Aunt   . Migraines Sister   . Colon cancer Neg Hx      SOCIAL HISTORY: Social History   Socioeconomic History  . Marital status: Divorced    Spouse name: Not on file  . Number of children: 0  . Years of education: 2 yrs college  . Highest  education level: Not on file  Occupational History  . Not on file  Tobacco Use  . Smoking status: Never Smoker  . Smokeless tobacco: Never Used  Vaping Use  . Vaping Use: Never used  Substance and Sexual Activity  . Alcohol use: Yes    Alcohol/week: 3.0 standard drinks    Types: 3 Standard drinks or equivalent per week    Comment: up to 3 per week  . Drug use: Never  . Sexual activity: Not on file  Other Topics Concern  . Not on file  Social History Narrative   Lives at home alone   Right handed   Caffeine: 1 soda or tea per day    Social Determinants of Health  Financial Resource Strain:   . Difficulty of Paying Living Expenses: Not on file  Food Insecurity:   . Worried About Charity fundraiser in the Last Year: Not on file  . Ran Out of Food in the Last Year: Not on file  Transportation Needs:   . Lack of Transportation (Medical): Not on file  . Lack of Transportation (Non-Medical): Not on file  Physical Activity:   . Days of Exercise per Week: Not on file  . Minutes of Exercise per Session: Not on file  Stress:   . Feeling of Stress : Not on file  Social Connections:   . Frequency of Communication with Friends and Family: Not on file  . Frequency of Social Gatherings with Friends and Family: Not on file  . Attends Religious Services: Not on file  . Active Member of Clubs or Organizations: Not on file  . Attends Archivist Meetings: Not on file  . Marital Status: Not on file  Intimate Partner Violence:   . Fear of Current or Ex-Partner: Not on file  . Emotionally Abused: Not on file  . Physically Abused: Not on file  . Sexually Abused: Not on file      PHYSICAL EXAM  There were no vitals filed for this visit. There is no height or weight on file to calculate BMI.   Generalized: Well developed, in no acute distress  Cardiology: normal rate and rhythm, no murmur auscultated  Respiratory: clear to auscultation bilaterally    Neurological  examination  Mentation: Alert oriented to time, place, history taking. Follows all commands speech and language fluent Cranial nerve II-XII: Pupils were equal round reactive to light. Extraocular movements were full, visual field were full on confrontational test. Facial sensation and strength were normal. Uvula tongue midline. Head turning and shoulder shrug  were normal and symmetric. Motor: The motor testing reveals 5 over 5 strength of all 4 extremities. Good symmetric motor tone is noted throughout.  Sensory: Sensory testing is intact to soft touch on all 4 extremities. No evidence of extinction is noted.  Coordination: Cerebellar testing reveals good finger-nose-finger and heel-to-shin bilaterally.  Gait and station: Gait is normal. Tandem gait is normal. Romberg is negative. No drift is seen.  Reflexes: Deep tendon reflexes are symmetric and normal bilaterally.     DIAGNOSTIC DATA (LABS, IMAGING, TESTING) - I reviewed patient records, labs, notes, testing and imaging myself where available.  Lab Results  Component Value Date   WBC 7.3 10/30/2019   HGB 14.3 10/30/2019   HCT 42.8 10/30/2019   MCV 93 10/30/2019   PLT 297 10/30/2019      Component Value Date/Time   NA 140 10/30/2019 0857   K 4.1 10/30/2019 0857   CL 106 10/30/2019 0857   CO2 21 10/30/2019 0857   GLUCOSE 87 10/30/2019 0857   GLUCOSE 101 (H) 08/08/2019 2043   BUN 10 10/30/2019 0857   CREATININE 0.66 10/30/2019 0857   CALCIUM 8.9 10/30/2019 0857   PROT 6.6 10/30/2019 0857   ALBUMIN 4.2 10/30/2019 0857   AST 18 10/30/2019 0857   ALT 22 10/30/2019 0857   ALKPHOS 77 10/30/2019 0857   BILITOT 0.3 10/30/2019 0857   GFRNONAA 114 10/30/2019 0857   GFRAA 131 10/30/2019 0857   No results found for: CHOL, HDL, LDLCALC, LDLDIRECT, TRIG, CHOLHDL No results found for: HGBA1C No results found for: VITAMINB12 Lab Results  Component Value Date   TSH 1.120 10/30/2019      ASSESSMENT AND  PLAN  34 y.o. year old  female  has a past medical history of Abdominal pain, Acute biliary pancreatitis, Depression, Dysmenorrhea, Endometriosis, Hernia, hiatal, HPV in female, IBS (irritable bowel syndrome), IC (interstitial cystitis), Migraine, and Ovarian cyst. here with ***  No diagnosis found.   I spent 20 minutes of face-to-face and non-face-to-face time with patient.  This included previsit chart review, lab review, study review, order entry, electronic health record documentation, patient education.    Debbora Presto, MSN, FNP-C 03/02/2020, 12:42 PM  [facility] [address] [place], [place] [address] [phone]

## 2020-04-06 ENCOUNTER — Encounter (HOSPITAL_BASED_OUTPATIENT_CLINIC_OR_DEPARTMENT_OTHER): Payer: Self-pay

## 2020-04-06 ENCOUNTER — Emergency Department (HOSPITAL_BASED_OUTPATIENT_CLINIC_OR_DEPARTMENT_OTHER)
Admission: EM | Admit: 2020-04-06 | Discharge: 2020-04-06 | Disposition: A | Discharge status: Home / Self Care | Payer: BC Managed Care – PPO | Attending: Emergency Medicine | Admitting: Emergency Medicine

## 2020-04-06 ENCOUNTER — Other Ambulatory Visit: Payer: Self-pay

## 2020-04-06 DIAGNOSIS — Z5321 Procedure and treatment not carried out due to patient leaving prior to being seen by health care provider: Secondary | ICD-10-CM | POA: Insufficient documentation

## 2020-04-06 DIAGNOSIS — Z20822 Contact with and (suspected) exposure to covid-19: Secondary | ICD-10-CM | POA: Diagnosis not present

## 2020-04-06 HISTORY — DX: Diverticulitis of intestine, part unspecified, without perforation or abscess without bleeding: K57.92

## 2020-04-06 NOTE — ED Triage Notes (Signed)
Pt c/o covid sx started 12/29-reports +covid exposure-states she had a neg "drive thru" covid test today-NAD-steady gait

## 2020-04-07 ENCOUNTER — Emergency Department (HOSPITAL_BASED_OUTPATIENT_CLINIC_OR_DEPARTMENT_OTHER)
Admission: EM | Admit: 2020-04-07 | Discharge: 2020-04-07 | Disposition: A | Discharge status: Home / Self Care | Payer: BC Managed Care – PPO | Attending: Emergency Medicine | Admitting: Emergency Medicine

## 2020-04-07 ENCOUNTER — Encounter (HOSPITAL_BASED_OUTPATIENT_CLINIC_OR_DEPARTMENT_OTHER): Payer: Self-pay | Admitting: Emergency Medicine

## 2020-04-07 ENCOUNTER — Other Ambulatory Visit: Payer: Self-pay

## 2020-04-07 ENCOUNTER — Emergency Department (HOSPITAL_BASED_OUTPATIENT_CLINIC_OR_DEPARTMENT_OTHER): Payer: BC Managed Care – PPO

## 2020-04-07 DIAGNOSIS — R0602 Shortness of breath: Secondary | ICD-10-CM | POA: Diagnosis present

## 2020-04-07 DIAGNOSIS — R439 Unspecified disturbances of smell and taste: Secondary | ICD-10-CM | POA: Diagnosis not present

## 2020-04-07 DIAGNOSIS — U071 COVID-19: Secondary | ICD-10-CM | POA: Insufficient documentation

## 2020-04-07 DIAGNOSIS — B342 Coronavirus infection, unspecified: Secondary | ICD-10-CM | POA: Insufficient documentation

## 2020-04-07 DIAGNOSIS — Z20822 Contact with and (suspected) exposure to covid-19: Secondary | ICD-10-CM

## 2020-04-07 DIAGNOSIS — Z79899 Other long term (current) drug therapy: Secondary | ICD-10-CM | POA: Diagnosis not present

## 2020-04-07 DIAGNOSIS — B349 Viral infection, unspecified: Secondary | ICD-10-CM | POA: Diagnosis not present

## 2020-04-07 DIAGNOSIS — R110 Nausea: Secondary | ICD-10-CM | POA: Diagnosis not present

## 2020-04-07 LAB — CBC
HCT: 41.1 % (ref 36.0–46.0)
Hemoglobin: 14.4 g/dL (ref 12.0–15.0)
MCH: 31.4 pg (ref 26.0–34.0)
MCHC: 35 g/dL (ref 30.0–36.0)
MCV: 89.7 fL (ref 80.0–100.0)
Platelets: 287 10*3/uL (ref 150–400)
RBC: 4.58 MIL/uL (ref 3.87–5.11)
RDW: 12.2 % (ref 11.5–15.5)
WBC: 11.2 10*3/uL — ABNORMAL HIGH (ref 4.0–10.5)
nRBC: 0 % (ref 0.0–0.2)

## 2020-04-07 LAB — URINALYSIS, ROUTINE W REFLEX MICROSCOPIC
Bilirubin Urine: NEGATIVE
Glucose, UA: NEGATIVE mg/dL
Ketones, ur: NEGATIVE mg/dL
Leukocytes,Ua: NEGATIVE
Nitrite: NEGATIVE
Protein, ur: NEGATIVE mg/dL
Specific Gravity, Urine: 1.015 (ref 1.005–1.030)
pH: 6.5 (ref 5.0–8.0)

## 2020-04-07 LAB — BASIC METABOLIC PANEL
Anion gap: 8 (ref 5–15)
BUN: 12 mg/dL (ref 6–20)
CO2: 22 mmol/L (ref 22–32)
Calcium: 8.9 mg/dL (ref 8.9–10.3)
Chloride: 110 mmol/L (ref 98–111)
Creatinine, Ser: 0.69 mg/dL (ref 0.44–1.00)
GFR, Estimated: >60 mL/min (ref >60)
Glucose, Bld: 117 mg/dL — ABNORMAL HIGH (ref 70–99)
Potassium: 3.4 mmol/L — ABNORMAL LOW (ref 3.5–5.1)
Sodium: 140 mmol/L (ref 135–145)

## 2020-04-07 LAB — URINALYSIS, MICROSCOPIC (REFLEX): WBC, UA: NONE SEEN WBC/hpf (ref 0–5)

## 2020-04-07 LAB — PREGNANCY, URINE: Preg Test, Ur: NEGATIVE

## 2020-04-07 MED ORDER — KETOROLAC TROMETHAMINE 60 MG/2ML IM SOLN
30.0000 mg | Freq: Once | INTRAMUSCULAR | Status: AC
  Administered 2020-04-07: 30 mg via INTRAMUSCULAR
  Filled 2020-04-07: qty 2

## 2020-04-07 MED ORDER — ALBUTEROL SULFATE HFA 108 (90 BASE) MCG/ACT IN AERS: 2.0000 | INHALATION_SPRAY | RESPIRATORY_TRACT | Status: DC | PRN

## 2020-04-07 NOTE — ED Triage Notes (Signed)
Pt arrives pov with c/o covid symptoms. Pt reports CP, N/V, loss of taste, HA and back pain. Pt reports shob x 6 days. Pt also reports left lower back pain. Pt reports chest tightness iwht deep inspiration

## 2020-04-07 NOTE — Discharge Instructions (Addendum)
Recommend following isolation precautions for suspected COVID-19.  Please check MyChart to review your results tomorrow.  If you develop worsening difficulty breathing, any chest pain or other new concerning symptom, please return to the emergency room for reassessment.  Recommend recheck with primary doctor in the next few days.

## 2020-04-08 LAB — SARS CORONAVIRUS 2 BY RT PCR (HOSPITAL ORDER, PERFORMED IN ~~LOC~~ HOSPITAL LAB): SARS Coronavirus 2: POSITIVE — AB

## 2020-04-08 NOTE — ED Provider Notes (Signed)
[facility] EMERGENCY DEPARTMENT Provider Note   CSN: [id number] Arrival date & time: 04/07/20  1656     History Chief Complaint  Patient presents with  . Shortness of Breath    Toni Fields is a 35 y.o. female.  Presented to ER with concern for Covid symptoms.  Patient reports her last week or so she has been having sense of feeling generally unwell, mild cough, nonproductive.  Also having aching in her back and head.  Has endorsed loss of taste and smell.  Has had associated nausea but no vomiting.  Had recent positive Covid exposure.  Reports having negative rapid test.  Denies shortness of breath at present but has noted intermittent shortness of breath over the last few days.  No chest pain.  HPI     Past Medical History:  Diagnosis Date  . Abdominal pain   . Acute biliary pancreatitis   . Depression   . Diverticulitis   . Dysmenorrhea   . Endometriosis   . Hernia, hiatal   . HPV in female   . IBS (irritable bowel syndrome)   . IC (interstitial cystitis)   . Migraine   . Ovarian cyst    resolved    Patient Active Problem List   Diagnosis Date Noted  . Chronic migraine without aura without status migrainosus, not intractable 10/30/2019  . Dysmenorrhea 08/02/2011  . Endometriosis 08/02/2011  . Infertility, female 08/02/2011  . Hidradenitis 08/02/2011  . Obesity 08/02/2011    Past Surgical History:  Procedure Laterality Date  . BLADDER SURGERY  2005  . CHOLECYSTECTOMY  2016  . CYSTOSCOPY    . ERCP  2019  . EXPLORATORY LAPAROTOMY  4/12   endometrosis  . NASAL ENDOSCOPY    . WISDOM TOOTH EXTRACTION       OB History    Gravida  0   Para      Term      Preterm      AB      Living        SAB      IAB      Ectopic      Multiple      Live Births              Family History  Problem Relation Age of Onset  . Diabetes Paternal Grandfather   . Breast cancer Maternal Grandmother   . Cervical cancer Maternal Grandmother   .  Ovarian cancer Maternal Grandmother   . Diabetes Maternal Grandfather   . Heart disease Father   . Heart attack Father   . Hypertension Father   . Diabetes Mother   . Hypertension Mother   . Hyperlipidemia Mother   . Breast cancer Mother   . Heart disease Mother   . Heart disease Paternal Uncle   . Heart disease Paternal Uncle   . Diabetes Paternal Uncle   . Heart disease Paternal Uncle   . Cancer Paternal Uncle        pancreatic  . Heart disease Paternal Uncle   . Diabetes Paternal Uncle   . Cancer Paternal Grandmother   . Breast cancer Maternal Aunt   . Migraines Sister   . Colon cancer Neg Hx     Social History   Tobacco Use  . Smoking status: Never Smoker  . Smokeless tobacco: Never Used  Vaping Use  . Vaping Use: Never used  Substance Use Topics  . Alcohol use: Yes    Comment: occ  .  Drug use: Never    Home Medications Prior to Admission medications   Medication Sig Start Date End Date Taking? Authorizing Provider  Fremanezumab-vfrm (AJOVY) 225 MG/1.5ML SOAJ Inject 225 mg into the skin every 30 (thirty) days. 10/30/19   Melvenia Beam, MD  Meth-Hyo-M Barnett Hatter Phos-Ph Sal (URIBEL) 118 MG CAPS TAKE 1 CAPSULE BY MOUTH EVERY DAY AS NEEDED 03/22/18   [provider]  methylPREDNISolone (MEDROL DOSEPAK) 4 MG TBPK tablet Take all pills daily in the morning with food for 6 days. 10/30/19   Melvenia Beam, MD  Multiple Vitamin (MULTIVITAMIN PO) Take by mouth daily.    [provider]  norethindrone (MICRONOR,CAMILA,ERRIN) 0.35 MG tablet Take by mouth. 10/06/17   [provider]  ondansetron (ZOFRAN-ODT) 4 MG disintegrating tablet Take 1 tablet (4 mg total) by mouth every 8 (eight) hours as needed for nausea. 10/30/19   Melvenia Beam, MD  rizatriptan (MAXALT-MLT) 10 MG disintegrating tablet Take 1 tablet (10 mg total) by mouth as needed for migraine. May repeat in 2 hours if needed 10/30/19   Melvenia Beam, MD    Allergies    Amoxicillin-pot  clavulanate, Sulfa antibiotics, and Latex  Review of Systems   Review of Systems  Constitutional: Positive for fatigue. Negative for chills and fever.  HENT: Negative for ear pain and sore throat.   Eyes: Negative for pain and visual disturbance.  Respiratory: Positive for cough and shortness of breath.   Cardiovascular: Negative for chest pain and palpitations.  Gastrointestinal: Negative for abdominal pain and vomiting.  Genitourinary: Negative for dysuria and hematuria.  Musculoskeletal: Positive for arthralgias and myalgias. Negative for back pain.  Skin: Negative for color change and rash.  Neurological: Positive for headaches. Negative for seizures and syncope.  All other systems reviewed and are negative.   Physical Exam Updated Vital Signs BP 116/78   Pulse 83   Temp 98.3 F (36.8 C) (Oral)   Resp 20   Ht _0  (1.676 m)   Wt 91.6 kg   LMP 04/02/2020   SpO2 100%   BMI 32.60 kg/m   Physical Exam Vitals and nursing note reviewed.  Constitutional:      General: She is not in acute distress.    Appearance: She is well-developed and well-nourished.  HENT:     Head: Normocephalic and atraumatic.  Eyes:     Conjunctiva/sclera: Conjunctivae normal.  Cardiovascular:     Rate and Rhythm: Normal rate and regular rhythm.     Heart sounds: No murmur heard.   Pulmonary:     Effort: Pulmonary effort is normal. No respiratory distress.     Breath sounds: Normal breath sounds.  Abdominal:     Palpations: Abdomen is soft.     Tenderness: There is no abdominal tenderness.  Musculoskeletal:        General: No edema.     Cervical back: Neck supple.  Skin:    General: Skin is warm and dry.     Capillary Refill: Capillary refill takes less than 2 seconds.  Neurological:     General: No focal deficit present.     Mental Status: She is alert.  Psychiatric:        Mood and Affect: Mood and affect and mood normal.        Behavior: Behavior normal.     ED Results /  Procedures / Treatments   Labs (all labs ordered are listed, but only abnormal results are displayed) Labs Reviewed  BASIC  METABOLIC PANEL - Abnormal; Notable for the following components:      Result Value   Potassium 3.4 (*)    Glucose, Bld 117 (*)    All other components within normal limits  CBC - Abnormal; Notable for the following components:   WBC 11.2 (*)    All other components within normal limits  URINALYSIS, ROUTINE W REFLEX MICROSCOPIC - Abnormal; Notable for the following components:   Color, Urine GREEN (*)    Hgb urine dipstick SMALL (*)    All other components within normal limits  URINALYSIS, MICROSCOPIC (REFLEX) - Abnormal; Notable for the following components:   Bacteria, UA RARE (*)    All other components within normal limits  SARS CORONAVIRUS 2 (TAT 6-24 HRS)  PREGNANCY, URINE    EKG None  Radiology DG Chest Portable 1 View  Result Date: 04/07/2020 CLINICAL DATA:  Chest pain, nausea and vomiting, loss of taste short of breath for 6 days EXAM: PORTABLE CHEST 1 VIEW COMPARISON:  10/04/2017 FINDINGS: The heart size and mediastinal contours are within normal limits. Both lungs are clear. The visualized skeletal structures are unremarkable. IMPRESSION: No active disease. Electronically Signed   By: Randa Ngo M.D.   On: 04/07/2020 21:32    Procedures Procedures (including critical care time)  Medications Ordered in ED Medications  ketorolac (TORADOL) injection 30 mg (30 mg Intramuscular Given 04/07/20 2125)    ED Course  I have reviewed the triage vital signs and the nursing notes.  Pertinent labs & imaging results that were available during my care of the patient were reviewed by me and considered in my medical decision making (see chart for details).    MDM Rules/Calculators/A&P                         35 year old lady presenting to ER with myriad symptoms concerning for viral illness.  On physical exam she is remarkably well-appearing in no acute  distress.  She has no tachypnea or hypoxia or tachycardia.  Her CXR is negative.  Basic labs within normal limits.  Suspect COVID-19 or other acute viral illness based on symptomatology and current physical exam.  Will send for COVID-19 PCR testing. Return precautions and isolation precautions.   After the discussed management above, the patient was determined to be safe for discharge.  The patient was in agreement with this plan and all questions regarding their care were answered.  ED return precautions were discussed and the patient will return to the ED with any significant worsening of condition.  Toni Fields was evaluated in Emergency Department on 04/08/2020 for the symptoms described in the history of present illness. She was evaluated in the context of the global COVID-19 pandemic, which necessitated consideration that the patient might be at risk for infection with the SARS-CoV-2 virus that causes COVID-19. Institutional protocols and algorithms that pertain to the evaluation of patients at risk for COVID-19 are in a state of rapid change based on information released by regulatory bodies including the CDC and federal and state organizations. These policies and algorithms were followed during the patient's care in the ED.  Final Clinical Impression(s) / ED Diagnoses Final diagnoses:  Suspected COVID-19 virus infection  Viral syndrome    Rx / DC Orders ED Discharge Orders    None       Lucrezia Starch, MD 04/08/20 1038

## 2020-10-01 ENCOUNTER — Other Ambulatory Visit: Payer: Self-pay

## 2020-10-01 ENCOUNTER — Emergency Department (HOSPITAL_BASED_OUTPATIENT_CLINIC_OR_DEPARTMENT_OTHER): Payer: Self-pay

## 2020-10-01 ENCOUNTER — Encounter (HOSPITAL_BASED_OUTPATIENT_CLINIC_OR_DEPARTMENT_OTHER): Payer: Self-pay | Admitting: Radiology

## 2020-10-01 ENCOUNTER — Emergency Department (HOSPITAL_BASED_OUTPATIENT_CLINIC_OR_DEPARTMENT_OTHER)
Admission: EM | Admit: 2020-10-01 | Discharge: 2020-10-01 | Disposition: A | Discharge status: Home / Self Care | Payer: Self-pay | Attending: Emergency Medicine | Admitting: Emergency Medicine

## 2020-10-01 DIAGNOSIS — K5792 Diverticulitis of intestine, part unspecified, without perforation or abscess without bleeding: Secondary | ICD-10-CM | POA: Insufficient documentation

## 2020-10-01 DIAGNOSIS — R1032 Left lower quadrant pain: Secondary | ICD-10-CM

## 2020-10-01 DIAGNOSIS — Z91040 Latex allergy status: Secondary | ICD-10-CM | POA: Insufficient documentation

## 2020-10-01 LAB — COMPREHENSIVE METABOLIC PANEL
ALT: 12 U/L (ref 0–44)
AST: 15 U/L (ref 15–41)
Albumin: 3.5 g/dL (ref 3.5–5.0)
Alkaline Phosphatase: 75 U/L (ref 38–126)
Anion gap: 4 — ABNORMAL LOW (ref 5–15)
BUN: 10 mg/dL (ref 6–20)
CO2: 22 mmol/L (ref 22–32)
Calcium: 8.3 mg/dL — ABNORMAL LOW (ref 8.9–10.3)
Chloride: 108 mmol/L (ref 98–111)
Creatinine, Ser: 0.64 mg/dL (ref 0.44–1.00)
GFR, Estimated: >60 mL/min (ref >60)
Glucose, Bld: 127 mg/dL — ABNORMAL HIGH (ref 70–99)
Potassium: 3.4 mmol/L — ABNORMAL LOW (ref 3.5–5.1)
Sodium: 134 mmol/L — ABNORMAL LOW (ref 135–145)
Total Bilirubin: 0.4 mg/dL (ref 0.3–1.2)
Total Protein: 6.3 g/dL — ABNORMAL LOW (ref 6.5–8.1)

## 2020-10-01 LAB — URINALYSIS, MICROSCOPIC (REFLEX)

## 2020-10-01 LAB — WET PREP, GENITAL
Sperm: NONE SEEN
Trich, Wet Prep: NONE SEEN
Yeast Wet Prep HPF POC: NONE SEEN

## 2020-10-01 LAB — CBC
HCT: 39.5 % (ref 36.0–46.0)
Hemoglobin: 13.6 g/dL (ref 12.0–15.0)
MCH: 31.6 pg (ref 26.0–34.0)
MCHC: 34.4 g/dL (ref 30.0–36.0)
MCV: 91.9 fL (ref 80.0–100.0)
Platelets: 262 10*3/uL (ref 150–400)
RBC: 4.3 MIL/uL (ref 3.87–5.11)
RDW: 12.8 % (ref 11.5–15.5)
WBC: 13.6 10*3/uL — ABNORMAL HIGH (ref 4.0–10.5)
nRBC: 0 % (ref 0.0–0.2)

## 2020-10-01 LAB — URINALYSIS, ROUTINE W REFLEX MICROSCOPIC
Bilirubin Urine: NEGATIVE
Glucose, UA: NEGATIVE mg/dL
Ketones, ur: NEGATIVE mg/dL
Leukocytes,Ua: NEGATIVE
Nitrite: NEGATIVE
Protein, ur: NEGATIVE mg/dL
Specific Gravity, Urine: 1.02 (ref 1.005–1.030)
pH: 7 (ref 5.0–8.0)

## 2020-10-01 LAB — LIPASE, BLOOD: Lipase: 24 U/L (ref 11–51)

## 2020-10-01 LAB — PREGNANCY, URINE: Preg Test, Ur: NEGATIVE

## 2020-10-01 MED ORDER — ONDANSETRON HCL 4 MG/2ML IJ SOLN
4.0000 mg | Freq: Once | INTRAMUSCULAR | Status: AC
  Administered 2020-10-01: 4 mg via INTRAVENOUS
  Filled 2020-10-01: qty 2

## 2020-10-01 MED ORDER — SODIUM CHLORIDE 0.9 % IV BOLUS
1000.0000 mL | Freq: Once | INTRAVENOUS | Status: AC
  Administered 2020-10-01: 1000 mL via INTRAVENOUS

## 2020-10-01 MED ORDER — MORPHINE SULFATE (PF) 4 MG/ML IV SOLN
4.0000 mg | Freq: Once | INTRAVENOUS | Status: AC
  Administered 2020-10-01: 4 mg via INTRAVENOUS
  Filled 2020-10-01: qty 1

## 2020-10-01 MED ORDER — ONDANSETRON 4 MG PO TBDP: 4.0000 mg | ORAL_TABLET | Freq: Three times a day (TID) | ORAL | 0 refills | Status: DC | PRN

## 2020-10-01 MED ORDER — SODIUM CHLORIDE 0.9 % IV BOLUS
500.0000 mL | Freq: Once | INTRAVENOUS | Status: AC
  Administered 2020-10-01: 500 mL via INTRAVENOUS

## 2020-10-01 MED ORDER — IOHEXOL 300 MG/ML  SOLN
100.0000 mL | Freq: Once | INTRAMUSCULAR | Status: AC | PRN
  Administered 2020-10-01: 100 mL via INTRAVENOUS

## 2020-10-01 MED ORDER — CIPROFLOXACIN HCL 500 MG PO TABS: 500.0000 mg | ORAL_TABLET | Freq: Two times a day (BID) | ORAL | 0 refills | Status: AC

## 2020-10-01 MED ORDER — FENTANYL CITRATE (PF) 100 MCG/2ML IJ SOLN
50.0000 ug | Freq: Once | INTRAMUSCULAR | Status: AC
  Administered 2020-10-01: 50 ug via INTRAVENOUS
  Filled 2020-10-01: qty 2

## 2020-10-01 MED ORDER — METRONIDAZOLE 500 MG PO TABS: 500.0000 mg | ORAL_TABLET | Freq: Three times a day (TID) | ORAL | 0 refills | Status: AC

## 2020-10-01 NOTE — ED Provider Notes (Signed)
[facility] EMERGENCY DEPARTMENT Provider Note   CSN: [id number] Arrival date & time: 10/01/20  1051     History Chief Complaint  Patient presents with   Abdominal Pain    Toni Fields is a 35 y.o. female with pertinent past medical history of pancreatitis, diverticulitis, endometriosis, IBS, interstitial cystitis, ovarian cyst, dysmenorrhea that presents emerged department today for left lower quadrant pain.  Patient states that pain started yesterday after standing up from her sitting position at work.  Denies any heavy lifting or activity.  Patient states that she vomited, and the pain has been getting worse in the left lower quadrant since yesterday.  States that she has been continuously vomiting.  Denies any fevers or diarrhea.  No pain elsewhere.  Pain radiates towards her side.  Denies any history of kidney stone.  Patient states that she does have multiple ovarian cysts on the left side, has had this pain before however pain normally passes.  Denies any vaginal bleeding, vaginal discharge or vaginal pain.  Denies pelvic pain.  Patient states that she is sexually active, however very sure that she does not have chance of STDs..  Last menstrual cycle was last week.  Denies any history or chance of pregnancy or STD.  Abdominal surgeries include cholecystectomy, bladder surgery and ex lap for endometriosis.  Patient denies any dysuria or hematuria.  Patient states that she has not had pain like this in a long time.  Denies any alcohol, substance use or marijuana use.  No other complaints at this time. No radiation to back.   HPI     Past Medical History:  Diagnosis Date   Abdominal pain    Acute biliary pancreatitis    Depression    Diverticulitis    Dysmenorrhea    Endometriosis    Hernia, hiatal    HPV in female    IBS (irritable bowel syndrome)    IC (interstitial cystitis)    Migraine    Ovarian cyst    resolved    Patient Active Problem List    Diagnosis Date Noted   Chronic migraine without aura without status migrainosus, not intractable 10/30/2019   Dysmenorrhea 08/02/2011   Endometriosis 08/02/2011   Infertility, female 08/02/2011   Hidradenitis 08/02/2011   Obesity 08/02/2011    Past Surgical History:  Procedure Laterality Date   BLADDER SURGERY  2005   CHOLECYSTECTOMY  2016   CYSTOSCOPY     ERCP  2019   EXPLORATORY LAPAROTOMY  4/12   endometrosis   NASAL ENDOSCOPY     WISDOM TOOTH EXTRACTION       OB History     Gravida  0   Para      Term      Preterm      AB      Living         SAB      IAB      Ectopic      Multiple      Live Births              Family History  Problem Relation Age of Onset   Diabetes Paternal Grandfather    Breast cancer Maternal Grandmother    Cervical cancer Maternal Grandmother    Ovarian cancer Maternal Grandmother    Diabetes Maternal Grandfather    Heart disease Father    Heart attack Father    Hypertension Father    Diabetes Mother    Hypertension Mother  Hyperlipidemia Mother    Breast cancer Mother    Heart disease Mother    Heart disease Paternal Uncle    Heart disease Paternal Uncle    Diabetes Paternal Uncle    Heart disease Paternal Uncle    Cancer Paternal Uncle        pancreatic   Heart disease Paternal Uncle    Diabetes Paternal Uncle    Cancer Paternal Grandmother    Breast cancer Maternal Aunt    Migraines Sister    Colon cancer Neg Hx     Social History   Tobacco Use   Smoking status: Never   Smokeless tobacco: Never  Vaping Use   Vaping Use: Never used  Substance Use Topics   Alcohol use: Yes    Comment: occ   Drug use: Never    Home Medications Prior to Admission medications   Medication Sig Start Date End Date Taking? Authorizing Provider  ciprofloxacin (CIPRO) 500 MG tablet Take 1 tablet (500 mg total) by mouth every 12 (twelve) hours for 7 days. 10/01/20 10/08/20 Yes Ladasia Sircy, PA-C  metroNIDAZOLE (FLAGYL)  500 MG tablet Take 1 tablet (500 mg total) by mouth 3 (three) times daily for 7 days. 10/01/20 10/08/20 Yes Shantaya Bluestone, PA-C  ondansetron (ZOFRAN ODT) 4 MG disintegrating tablet Take 1 tablet (4 mg total) by mouth every 8 (eight) hours as needed for nausea or vomiting. 10/01/20  Yes Isabell Bonafede, PA-C  Fremanezumab-vfrm (AJOVY) 225 MG/1.5ML SOAJ Inject 225 mg into the skin every 30 (thirty) days. 10/30/19   Melvenia Beam, MD  Meth-Hyo-M Barnett Hatter Phos-Ph Sal (URIBEL) 118 MG CAPS TAKE 1 CAPSULE BY MOUTH EVERY DAY AS NEEDED 03/22/18   [provider]  methylPREDNISolone (MEDROL DOSEPAK) 4 MG TBPK tablet Take all pills daily in the morning with food for 6 days. 10/30/19   Melvenia Beam, MD  Multiple Vitamin (MULTIVITAMIN PO) Take by mouth daily.    [provider]  norethindrone (MICRONOR,CAMILA,ERRIN) 0.35 MG tablet Take by mouth. 10/06/17   [provider]  rizatriptan (MAXALT-MLT) 10 MG disintegrating tablet Take 1 tablet (10 mg total) by mouth as needed for migraine. May repeat in 2 hours if needed 10/30/19   Melvenia Beam, MD    Allergies    Amoxicillin-pot clavulanate, Sulfa antibiotics, and Latex  Review of Systems   Review of Systems  Constitutional:  Negative for chills, diaphoresis, fatigue and fever.  HENT:  Negative for congestion, sore throat and trouble swallowing.   Eyes:  Negative for pain and visual disturbance.  Respiratory:  Negative for cough, shortness of breath and wheezing.   Cardiovascular:  Negative for chest pain, palpitations and leg swelling.  Gastrointestinal:  Positive for abdominal pain, nausea and vomiting. Negative for abdominal distention and diarrhea.  Genitourinary:  Negative for difficulty urinating.  Musculoskeletal:  Negative for back pain, neck pain and neck stiffness.  Skin:  Negative for pallor.  Neurological:  Negative for dizziness, speech difficulty, weakness and headaches.  Psychiatric/Behavioral:  Negative for confusion.     Physical Exam Updated Vital Signs BP (!) 102/58   Pulse 78   Temp 98.2 F (36.8 C) (Oral)   Resp 15   LMP 09/14/2020 (Exact Date)   SpO2 99%   Physical Exam Constitutional:      General: She is not in acute distress.    Appearance: Normal appearance. She is not ill-appearing, toxic-appearing or diaphoretic.  HENT:     Mouth/Throat:     Mouth: Mucous membranes  are moist.     Pharynx: Oropharynx is clear.  Eyes:     General: No scleral icterus.    Extraocular Movements: Extraocular movements intact.     Pupils: Pupils are equal, round, and reactive to light.  Cardiovascular:     Rate and Rhythm: Normal rate and regular rhythm.     Pulses: Normal pulses.     Heart sounds: Normal heart sounds.  Pulmonary:     Effort: Pulmonary effort is normal. No respiratory distress.     Breath sounds: Normal breath sounds. No stridor. No wheezing, rhonchi or rales.  Chest:     Chest wall: No tenderness.  Abdominal:     General: Abdomen is flat. There is no distension.     Palpations: Abdomen is soft.     Tenderness: There is abdominal tenderness in the left lower quadrant. There is guarding. There is no rebound.  Genitourinary:    Comments: Chaperone present.  Normal external vaginal exam.  Internal vaginal exam with normal vaginal canal.  Cervix closed, no discharge or friability.  Bimanual exam with no cervical motion tenderness, some discomfort to left side on bimanual.  Musculoskeletal:        General: No swelling or tenderness. Normal range of motion.     Cervical back: Normal range of motion and neck supple. No rigidity.     Right lower leg: No edema.     Left lower leg: No edema.  Skin:    General: Skin is warm and dry.     Capillary Refill: Capillary refill takes less than 2 seconds.     Coloration: Skin is not pale.  Neurological:     General: No focal deficit present.     Mental Status: She is alert and oriented to person, place, and time.  Psychiatric:        Mood and  Affect: Mood normal.        Behavior: Behavior normal.    ED Results / Procedures / Treatments   Labs (all labs ordered are listed, but only abnormal results are displayed) Labs Reviewed  WET PREP, GENITAL - Abnormal; Notable for the following components:      Result Value   Clue Cells Wet Prep HPF POC PRESENT (*)    WBC, Wet Prep HPF POC MODERATE (*)    All other components within normal limits  COMPREHENSIVE METABOLIC PANEL - Abnormal; Notable for the following components:   Sodium 134 (*)    Potassium 3.4 (*)    Glucose, Bld 127 (*)    Calcium 8.3 (*)    Total Protein 6.3 (*)    Anion gap 4 (*)    All other components within normal limits  CBC - Abnormal; Notable for the following components:   WBC 13.6 (*)    All other components within normal limits  URINALYSIS, ROUTINE W REFLEX MICROSCOPIC - Abnormal; Notable for the following components:   Hgb urine dipstick SMALL (*)    All other components within normal limits  URINALYSIS, MICROSCOPIC (REFLEX) - Abnormal; Notable for the following components:   Bacteria, UA FEW (*)    All other components within normal limits  LIPASE, BLOOD  PREGNANCY, URINE  GC/CHLAMYDIA PROBE AMP ([place]) NOT AT [facility]    EKG EKG Interpretation  Date/Time:  Thursday October 01 2020 13:38:54 EDT Ventricular Rate:  69 PR Interval:  158 QRS Duration: 84 QT Interval:  414 QTC Calculation: 444 R Axis:   52 Text Interpretation: Sinus arrhythmia Since last  tracing Rate slower Confirmed by Calvert Cantor [phone]) on 10/01/2020 1:43:38 PM  Radiology CT Abdomen Pelvis W Contrast  Result Date: 10/01/2020 CLINICAL DATA:  Left lower quadrant pain EXAM: CT ABDOMEN AND PELVIS WITH CONTRAST TECHNIQUE: Multidetector CT imaging of the abdomen and pelvis was performed using the standard protocol following bolus administration of intravenous contrast. CONTRAST:  129mL OMNIPAQUE IOHEXOL 300 MG/ML  SOLN COMPARISON:  08/08/2019 FINDINGS: Lower chest: No acute  abnormality. Hepatobiliary: No focal liver abnormality is seen. Status post cholecystectomy. No biliary dilatation. Pancreas: Unremarkable. No pancreatic ductal dilatation or surrounding inflammatory changes. Spleen: Normal in size without significant abnormality. Adrenals/Urinary Tract: Adrenal glands are unremarkable. Kidneys are normal, without renal calculi, solid lesion, or hydronephrosis. Bladder is unremarkable. Stomach/Bowel: Stomach is within normal limits. Appendix appears normal. Descending and sigmoid diverticula. Focal wall thickening and fat stranding about a diverticulum of the mid descending colon (series 2, image 42). Vascular/Lymphatic: No significant vascular findings are present. No enlarged abdominal or pelvic lymph nodes. Reproductive: No mass or other significant abnormality. Corpus luteum of the left ovary. Other: No abdominal wall hernia or abnormality. Trace free fluid in the low pelvis. Musculoskeletal: No acute or significant osseous findings. IMPRESSION: 1. Descending and sigmoid diverticula with focal wall thickening and fat stranding about a diverticulum of the mid descending colon, consistent with acute diverticulitis. No evidence of perforation or abscess. 2. Trace free fluid in the low pelvis, likely reactive. 3. Status post cholecystectomy. Electronically Signed   By: Eddie Candle M.D.   On: 10/01/2020 12:59   US PELVIC COMPLETE W TRANSVAGINAL AND TORSION R/O  Result Date: 10/01/2020 CLINICAL DATA:  Severe left lower quadrant abdominal pain EXAM: TRANSABDOMINAL AND TRANSVAGINAL ULTRASOUND OF PELVIS DOPPLER ULTRASOUND OF OVARIES TECHNIQUE: Both transabdominal and transvaginal ultrasound examinations of the pelvis were performed. Transabdominal technique was performed for global imaging of the pelvis including uterus, ovaries, adnexal regions, and pelvic cul-de-sac. It was necessary to proceed with endovaginal exam following the transabdominal exam to visualize the uterus,  endometrium, ovaries, and adnexa. Color and duplex Doppler ultrasound was utilized to evaluate blood flow to the ovaries. COMPARISON:  None. FINDINGS: Uterus Measurements: 7.2 x 4.1 x 4.5 cm = volume: 69 mL. No fibroids or other mass visualized. Endometrium Thickness: 2 mm.  No focal abnormality visualized. Right ovary Measurements: 3.0 x 1.1 x 1.5 cm = volume: 3 mL. Normal appearance/no adnexal mass. Subcentimeter follicles. Left ovary Measurements: 2.4 x 1.6 x 3.6 cm = volume: 8 mL. Normal appearance/no adnexal mass. Corpus luteum of the left ovary. Pulsed Doppler evaluation of both ovaries demonstrates normal low-resistance arterial and venous waveforms. Other findings Trace free fluid in the low pelvis. IMPRESSION: 1. No ultrasound abnormality of the pelvis to explain left lower quadrant pain. 2. Corpus luteum of the left ovary. 3. Trace, nonspecific free fluid in the low pelvis, likely functional in the reproductive age setting. Electronically Signed   By: Eddie Candle M.D.   On: 10/01/2020 12:55    Procedures Procedures   Medications Ordered in ED Medications  sodium chloride 0.9 % bolus 1,000 mL (0 mLs Intravenous Stopped 10/01/20 1318)  ondansetron (ZOFRAN) injection 4 mg (4 mg Intravenous Given 10/01/20 1202)  fentaNYL (SUBLIMAZE) injection 50 mcg (50 mcg Intravenous Given 10/01/20 1203)  iohexol (OMNIPAQUE) 300 MG/ML solution 100 mL (100 mLs Intravenous Contrast Given 10/01/20 1235)  morphine 4 MG/ML injection 4 mg (4 mg Intravenous Given 10/01/20 1315)  ondansetron (ZOFRAN) injection 4 mg (4 mg Intravenous Given 10/01/20 1340)  sodium chloride 0.9 % bolus 500 mL (0 mLs Intravenous Stopped 10/01/20 1608)  fentaNYL (SUBLIMAZE) injection 50 mcg (50 mcg Intravenous Given 10/01/20 1538)    ED Course  I have reviewed the triage vital signs and the nursing notes.  Pertinent labs & imaging results that were available during my care of the patient were reviewed by me and considered in my medical  decision making (see chart for details).    MDM Rules/Calculators/A&P                          Patient presents emerged department today for left lower quadrant pain, hemodynamically stable, nontoxic-appearing.  Differential to include ovarian torsion with history of ovarian cyst on left side.  Patient also has history of diverticulitis which is on the differential as well.  Also consider SBO with multiple abdominal surgeries.  Initial interventions include fentanyl, Zofran and fluids.  Will obtain basic labs, ultrasound and CT imaging.  Pelvic exam with some tenderness on left side.   Work-up today shows remarkable for CBC with white count of 13.6, otherwise lab work unremarkable.  CT abdomen pelvis does show acute diverticulitis.  Did discuss this with patient, ultrasound benign.  Please see findings below.    IMPRESSION:  1. Descending and sigmoid diverticula with focal wall thickening and  fat stranding about a diverticulum of the mid descending colon,  consistent with acute diverticulitis. No evidence of perforation or  abscess.  2. Trace free fluid in the low pelvis, likely reactive.  3. Status post cholecystectomy.   IMPRESSION:  1. No ultrasound abnormality of the pelvis to explain left lower  quadrant pain.  2. Corpus luteum of the left ovary.  3. Trace, nonspecific free fluid in the low pelvis, likely  functional in the reproductive age setting.   Upon reevaluation, unfortunately patient having reaction of anxiety and chest pressure with morphine, after an hour, patient states that she feels much better.  EKG without any abnormalities.  Low concerns for ACS.  No shortness of breath, no vital changes.  Patient is now been observed for couple hours after this and she feels much better.  We will treat for acute diverticulitis this time.  Appropriate symptomatic treatment discussed, strict warnings on antibiotics provided in regards to side effects.  Patient aware and expressed  understanding, she will follow-up with GI doctor.  Do not think that patient had any signs of PID, pelvic exam tenderness on left side most likely coming from left lower quadrant abdominal pain.  Patient is not concerned for STDs at this time.  Patient is not having any vaginal symptoms, do not think the need to treat clue cells on wet prep at this time.  Patient to be discharged at this time, pain has been controlled, patient passed p.o. challenge.  Doubt need for further emergent work up at this time. I explained the diagnosis and have given explicit precautions to return to the ER including for any other new or worsening symptoms. The patient understands and accepts the medical plan as it's been dictated and I have answered their questions. Discharge instructions concerning home care and prescriptions have been given. The patient is STABLE and is discharged to home in good condition.  Final Clinical Impression(s) / ED Diagnoses Final diagnoses:  Acute diverticulitis    Rx / DC Orders ED Discharge Orders          Ordered    metroNIDAZOLE (FLAGYL) 500 MG tablet  3  times daily        10/01/20 1346    ciprofloxacin (CIPRO) 500 MG tablet  Every 12 hours        10/01/20 1346    ondansetron (ZOFRAN ODT) 4 MG disintegrating tablet  Every 8 hours PRN        10/01/20 1622             Alfredia Client, PA-C 10/01/20 1627    Truddie Hidden, MD 10/03/20 [phone]

## 2020-10-01 NOTE — ED Notes (Signed)
Reports abdominal pain now a 2/10 but chest pain rated at 10/10.

## 2020-10-01 NOTE — ED Notes (Signed)
Patient currently in CT will update vitals and pain score when patient returns

## 2020-10-01 NOTE — ED Notes (Signed)
[place] at bedside, will update vitals when [place] finished

## 2020-10-01 NOTE — ED Notes (Signed)
Reports CP is relieved but LLQ abdominal pain is back up to 7-8/10.  Provider made aware.

## 2020-10-01 NOTE — ED Triage Notes (Addendum)
LLQ pain since yesterday 1730, N/V/D.  Extensive GI/OB history.

## 2020-10-01 NOTE — Discharge Instructions (Addendum)
  You were evaluated in the Emergency Department and after careful evaluation, we did not find any emergent condition requiring admission or further testing in the hospital.   Your exam/testing today was overall reassuring.  Symptoms seem to be due to  diverticulitis.  Please return to the Emergency Department if you experience any worsening of your condition.  Thank you for allowing [place] to be a part of your care. Please speak to your pharmacist about any new medications prescribed today in regards to side effects or interactions with other medications.  As we discussed please do not drink alcohol when taking the Flagyl.  Do not go on any runs when taking the Cipro.  Pelvic and CT findings below IMPRESSION:  1. No ultrasound abnormality of the pelvis to explain left lower  quadrant pain.  2. Corpus luteum of the left ovary.  3. Trace, nonspecific free fluid in the low pelvis, likely  functional in the reproductive age setting.   IMPRESSION:  1. Descending and sigmoid diverticula with focal wall thickening and  fat stranding about a diverticulum of the mid descending colon,  consistent with acute diverticulitis. No evidence of perforation or  abscess.  2. Trace free fluid in the low pelvis, likely reactive.  3. Status post cholecystectomy.

## 2020-10-01 NOTE — ED Notes (Signed)
Food provided for po challenge.

## 2020-10-02 LAB — GC/CHLAMYDIA PROBE AMP (~~LOC~~) NOT AT ARMC
Chlamydia: NEGATIVE
Comment: NEGATIVE
Comment: NORMAL
Neisseria Gonorrhea: NEGATIVE

## 2020-10-19 ENCOUNTER — Encounter (HOSPITAL_BASED_OUTPATIENT_CLINIC_OR_DEPARTMENT_OTHER): Payer: Self-pay

## 2020-10-19 ENCOUNTER — Other Ambulatory Visit: Payer: Self-pay

## 2020-10-19 ENCOUNTER — Emergency Department (HOSPITAL_BASED_OUTPATIENT_CLINIC_OR_DEPARTMENT_OTHER)
Admission: EM | Admit: 2020-10-19 | Discharge: 2020-10-19 | Disposition: A | Discharge status: Home / Self Care | Payer: 59 | Attending: Emergency Medicine | Admitting: Emergency Medicine

## 2020-10-19 DIAGNOSIS — Z91040 Latex allergy status: Secondary | ICD-10-CM | POA: Diagnosis not present

## 2020-10-19 DIAGNOSIS — R109 Unspecified abdominal pain: Secondary | ICD-10-CM | POA: Diagnosis present

## 2020-10-19 DIAGNOSIS — J029 Acute pharyngitis, unspecified: Secondary | ICD-10-CM | POA: Diagnosis not present

## 2020-10-19 DIAGNOSIS — E876 Hypokalemia: Secondary | ICD-10-CM

## 2020-10-19 DIAGNOSIS — R1084 Generalized abdominal pain: Secondary | ICD-10-CM

## 2020-10-19 LAB — COMPREHENSIVE METABOLIC PANEL
ALT: 14 U/L (ref 0–44)
AST: 15 U/L (ref 15–41)
Albumin: 3.7 g/dL (ref 3.5–5.0)
Alkaline Phosphatase: 83 U/L (ref 38–126)
Anion gap: 8 (ref 5–15)
BUN: 9 mg/dL (ref 6–20)
CO2: 24 mmol/L (ref 22–32)
Calcium: 8.5 mg/dL — ABNORMAL LOW (ref 8.9–10.3)
Chloride: 105 mmol/L (ref 98–111)
Creatinine, Ser: 0.6 mg/dL (ref 0.44–1.00)
GFR, Estimated: >60 mL/min (ref >60)
Glucose, Bld: 100 mg/dL — ABNORMAL HIGH (ref 70–99)
Potassium: 3 mmol/L — ABNORMAL LOW (ref 3.5–5.1)
Sodium: 137 mmol/L (ref 135–145)
Total Bilirubin: 0.3 mg/dL (ref 0.3–1.2)
Total Protein: 6.8 g/dL (ref 6.5–8.1)

## 2020-10-19 LAB — URINALYSIS, MICROSCOPIC (REFLEX)

## 2020-10-19 LAB — CBC
HCT: 38.7 % (ref 36.0–46.0)
Hemoglobin: 13.2 g/dL (ref 12.0–15.0)
MCH: 31.3 pg (ref 26.0–34.0)
MCHC: 34.1 g/dL (ref 30.0–36.0)
MCV: 91.7 fL (ref 80.0–100.0)
Platelets: 267 10*3/uL (ref 150–400)
RBC: 4.22 MIL/uL (ref 3.87–5.11)
RDW: 12.9 % (ref 11.5–15.5)
WBC: 14.6 10*3/uL — ABNORMAL HIGH (ref 4.0–10.5)
nRBC: 0 % (ref 0.0–0.2)

## 2020-10-19 LAB — URINALYSIS, ROUTINE W REFLEX MICROSCOPIC
Glucose, UA: NEGATIVE mg/dL
Ketones, ur: NEGATIVE mg/dL
Leukocytes,Ua: NEGATIVE
Nitrite: NEGATIVE
Protein, ur: 30 mg/dL — AB
Specific Gravity, Urine: >1.03 — ABNORMAL HIGH (ref 1.005–1.030)
pH: 5.5 (ref 5.0–8.0)

## 2020-10-19 LAB — LIPASE, BLOOD: Lipase: 24 U/L (ref 11–51)

## 2020-10-19 LAB — GROUP A STREP BY PCR: Group A Strep by PCR: NOT DETECTED

## 2020-10-19 LAB — PREGNANCY, URINE: Preg Test, Ur: NEGATIVE

## 2020-10-19 MED ORDER — POTASSIUM CHLORIDE CRYS ER 20 MEQ PO TBCR
40.0000 meq | EXTENDED_RELEASE_TABLET | Freq: Once | ORAL | Status: AC
  Administered 2020-10-19: 40 meq via ORAL
  Filled 2020-10-19: qty 2

## 2020-10-19 MED ORDER — POTASSIUM CHLORIDE CRYS ER 20 MEQ PO TBCR: EXTENDED_RELEASE_TABLET | ORAL | 0 refills | Status: DC

## 2020-10-19 MED ORDER — ACETAMINOPHEN 500 MG PO TABS
1000.0000 mg | ORAL_TABLET | Freq: Once | ORAL | Status: AC
  Administered 2020-10-19: 1000 mg via ORAL
  Filled 2020-10-19: qty 2

## 2020-10-19 NOTE — Discharge Instructions (Signed)
It was our pleasure to provide your ER care today - we hope that you feel better.  Your ct scan from earlier today was read as showing no acute process.   Your strep test is negative.   From tonights labs, your potassium level is mildly low (3) -  eat plenty of fruits and vegetables, take potassium supplement as prescribed, and follow u with primary care doctor in 1 week.  Take acetaminophen or ibuprofen as need. Stay well hydrated/drink plenty of fluids.  Follow up with your doctor in one week if symptoms fail to improve/resolve.  Return to ER if worse, new symptoms, high fevers, severe abdominal pain, persistent vomiting, or other concern.

## 2020-10-19 NOTE — ED Provider Notes (Signed)
[facility] Provider Note   CSN: [id number] Arrival date & time: 10/19/20  1904     History Chief Complaint  Patient presents with   Abdominal Pain    Toni Fields is a 35 y.o. female.  Patient c/o abdominal pain, states was seen in ED 6/30 with LLQ pain and dx with diverticulitis then. States completed abx course then, but states in past few days pain had recurred, so saw her doctor/gi, and had repeat ct done this AM - pt unsure of results. Pain gradual onset, dull, cramping, bilateral abd and flank. No vomiting. No diarrhea or constipation. No dysuria or hematuria. No vaginal discharge or bleeding. No fever or chills. Also notes sore throat in past day. No specific known ill contacts. No cough. No runny nose. Is able to swallow, but soreness w swallowing.   The history is provided by the patient.  Abdominal Pain Associated symptoms: sore throat   Associated symptoms: no chest pain, no chills, no cough, no diarrhea, no dysuria, no fever, no shortness of breath and no vomiting       Past Medical History:  Diagnosis Date   Abdominal pain    Acute biliary pancreatitis    Depression    Diverticulitis    Dysmenorrhea    Endometriosis    Hernia, hiatal    HPV in female    IBS (irritable bowel syndrome)    IC (interstitial cystitis)    Migraine    Ovarian cyst    resolved    Patient Active Problem List   Diagnosis Date Noted   Chronic migraine without aura without status migrainosus, not intractable 10/30/2019   Dysmenorrhea 08/02/2011   Endometriosis 08/02/2011   Infertility, female 08/02/2011   Hidradenitis 08/02/2011   Obesity 08/02/2011    Past Surgical History:  Procedure Laterality Date   BLADDER SURGERY  2005   CHOLECYSTECTOMY  2016   CYSTOSCOPY     ERCP  2019   EXPLORATORY LAPAROTOMY  4/12   endometrosis   NASAL ENDOSCOPY     WISDOM TOOTH EXTRACTION       OB History     Gravida  0   Para      Term      Preterm       AB      Living         SAB      IAB      Ectopic      Multiple      Live Births              Family History  Problem Relation Age of Onset   Diabetes Paternal Grandfather    Breast cancer Maternal Grandmother    Cervical cancer Maternal Grandmother    Ovarian cancer Maternal Grandmother    Diabetes Maternal Grandfather    Heart disease Father    Heart attack Father    Hypertension Father    Diabetes Mother    Hypertension Mother    Hyperlipidemia Mother    Breast cancer Mother    Heart disease Mother    Heart disease Paternal Uncle    Heart disease Paternal Uncle    Diabetes Paternal Uncle    Heart disease Paternal Uncle    Cancer Paternal Uncle        pancreatic   Heart disease Paternal Uncle    Diabetes Paternal Uncle    Cancer Paternal Grandmother    Breast cancer Maternal Aunt    Migraines  Sister    Colon cancer Neg Hx     Social History   Tobacco Use   Smoking status: Never   Smokeless tobacco: Never  Vaping Use   Vaping Use: Never used  Substance Use Topics   Alcohol use: Not Currently   Drug use: Never    Home Medications Prior to Admission medications   Medication Sig Start Date End Date Taking? Authorizing Provider  Fremanezumab-vfrm (AJOVY) 225 MG/1.5ML SOAJ Inject 225 mg into the skin every 30 (thirty) days. 10/30/19   Melvenia Beam, MD  Meth-Hyo-M Barnett Hatter Phos-Ph Sal (URIBEL) 118 MG CAPS TAKE 1 CAPSULE BY MOUTH EVERY DAY AS NEEDED 03/22/18   [provider]  methylPREDNISolone (MEDROL DOSEPAK) 4 MG TBPK tablet Take all pills daily in the morning with food for 6 days. 10/30/19   Melvenia Beam, MD  Multiple Vitamin (MULTIVITAMIN PO) Take by mouth daily.    [provider]  norethindrone (MICRONOR,CAMILA,ERRIN) 0.35 MG tablet Take by mouth. 10/06/17   [provider]  ondansetron (ZOFRAN ODT) 4 MG disintegrating tablet Take 1 tablet (4 mg total) by mouth every 8 (eight) hours as needed for nausea or  vomiting. 10/01/20   Alfredia Client, PA-C  rizatriptan (MAXALT-MLT) 10 MG disintegrating tablet Take 1 tablet (10 mg total) by mouth as needed for migraine. May repeat in 2 hours if needed 10/30/19   Melvenia Beam, MD    Allergies    Amoxicillin-pot clavulanate, Sulfa antibiotics, and Latex  Review of Systems   Review of Systems  Constitutional:  Negative for chills and fever.  HENT:  Positive for sore throat.   Eyes:  Negative for redness.  Respiratory:  Negative for cough and shortness of breath.   Cardiovascular:  Negative for chest pain.  Gastrointestinal:  Positive for abdominal pain. Negative for diarrhea and vomiting.  Genitourinary:  Negative for dysuria and flank pain.  Musculoskeletal:  Negative for neck pain and neck stiffness.  Skin:  Negative for rash.  Neurological:  Negative for headaches.  Hematological:  Does not bruise/bleed easily.  Psychiatric/Behavioral:  Negative for confusion.    Physical Exam Updated Vital Signs BP 101/68 (BP Location: Right Arm)   Pulse 79   Temp 98.7 F (37.1 C) (Oral)   Resp 18   Ht 1.676 m (5\' 6" )   Wt 93.9 kg   LMP 10/12/2020   SpO2 98%   BMI 33.41 kg/m   Physical Exam Vitals and nursing note reviewed.  Constitutional:      Appearance: Normal appearance. She is well-developed.  HENT:     Head: Atraumatic.     Nose: Nose normal.     Mouth/Throat:     Mouth: Mucous membranes are moist.     Pharynx: Oropharyngeal exudate and posterior oropharyngeal erythema present.     Comments: Pharynx mildly erythematous w scant exudate. No asymmetric swelling or abscess. No trismus.  Eyes:     General: No scleral icterus.    Conjunctiva/sclera: Conjunctivae normal.  Neck:     Trachea: No tracheal deviation.     Comments: No stiffness or rigidity.  Cardiovascular:     Rate and Rhythm: Normal rate and regular rhythm.     Pulses: Normal pulses.     Heart sounds: Normal heart sounds. No murmur heard.   No friction rub. No gallop.   Pulmonary:     Effort: Pulmonary effort is normal. No respiratory distress.     Breath sounds: Normal breath sounds.  Abdominal:  General: Bowel sounds are normal. There is no distension.     Palpations: Abdomen is soft. There is no mass.     Tenderness: There is no abdominal tenderness. There is no guarding.     Hernia: No hernia is present.  Genitourinary:    Comments: No cva tenderness.  Musculoskeletal:        General: No swelling or tenderness.     Cervical back: Normal range of motion and neck supple. No rigidity. No muscular tenderness.  Lymphadenopathy:     Cervical: No cervical adenopathy.  Skin:    General: Skin is warm and dry.     Findings: No rash.  Neurological:     Mental Status: She is alert.     Comments: Alert, speech normal.   Psychiatric:        Mood and Affect: Mood normal.    ED Results / Procedures / Treatments   Labs (all labs ordered are listed, but only abnormal results are displayed) Results for orders placed or performed during the hospital encounter of 10/19/20  Group A Strep by PCR   Specimen: Throat; Sterile Swab  Result Value Ref Range   Group A Strep by PCR NOT DETECTED NOT DETECTED  Lipase, blood  Result Value Ref Range   Lipase 24 11 - 51 U/L  Comprehensive metabolic panel  Result Value Ref Range   Sodium 137 135 - 145 mmol/L   Potassium 3.0 (L) 3.5 - 5.1 mmol/L   Chloride 105 98 - 111 mmol/L   CO2 24 22 - 32 mmol/L   Glucose, Bld 100 (H) 70 - 99 mg/dL   BUN 9 6 - 20 mg/dL   Creatinine, Ser 0.60 0.44 - 1.00 mg/dL   Calcium 8.5 (L) 8.9 - 10.3 mg/dL   Total Protein 6.8 6.5 - 8.1 g/dL   Albumin 3.7 3.5 - 5.0 g/dL   AST 15 15 - 41 U/L   ALT 14 0 - 44 U/L   Alkaline Phosphatase 83 38 - 126 U/L   Total Bilirubin 0.3 0.3 - 1.2 mg/dL   GFR, Estimated >60 >60 mL/min   Anion gap 8 5 - 15  CBC  Result Value Ref Range   WBC 14.6 (H) 4.0 - 10.5 K/uL   RBC 4.22 3.87 - 5.11 MIL/uL   Hemoglobin 13.2 12.0 - 15.0 g/dL   HCT 38.7 36.0 -  46.0 %   MCV 91.7 80.0 - 100.0 fL   MCH 31.3 26.0 - 34.0 pg   MCHC 34.1 30.0 - 36.0 g/dL   RDW 12.9 11.5 - 15.5 %   Platelets 267 150 - 400 K/uL   nRBC 0.0 0.0 - 0.2 %  Urinalysis, Routine w reflex microscopic Urine, Clean Catch  Result Value Ref Range   Color, Urine YELLOW YELLOW   APPearance CLEAR CLEAR   Specific Gravity, Urine >1.030 (H) 1.005 - 1.030   pH 5.5 5.0 - 8.0   Glucose, UA NEGATIVE NEGATIVE mg/dL   Hgb urine dipstick MODERATE (A) NEGATIVE   Bilirubin Urine SMALL (A) NEGATIVE   Ketones, ur NEGATIVE NEGATIVE mg/dL   Protein, ur 30 (A) NEGATIVE mg/dL   Nitrite NEGATIVE NEGATIVE   Leukocytes,Ua NEGATIVE NEGATIVE  Pregnancy, urine  Result Value Ref Range   Preg Test, Ur NEGATIVE NEGATIVE  Urinalysis, Microscopic (reflex)  Result Value Ref Range   RBC / HPF 0-5 0 - 5 RBC/hpf   WBC, UA 0-5 0 - 5 WBC/hpf   Bacteria, UA FEW (A) NONE SEEN  Squamous Epithelial / LPF 0-5 0 - 5   Mucus PRESENT    CT Abdomen Pelvis W Contrast  Result Date: 10/01/2020 CLINICAL DATA:  Left lower quadrant pain EXAM: CT ABDOMEN AND PELVIS WITH CONTRAST TECHNIQUE: Multidetector CT imaging of the abdomen and pelvis was performed using the standard protocol following bolus administration of intravenous contrast. CONTRAST:  136mL OMNIPAQUE IOHEXOL 300 MG/ML  SOLN COMPARISON:  08/08/2019 FINDINGS: Lower chest: No acute abnormality. Hepatobiliary: No focal liver abnormality is seen. Status post cholecystectomy. No biliary dilatation. Pancreas: Unremarkable. No pancreatic ductal dilatation or surrounding inflammatory changes. Spleen: Normal in size without significant abnormality. Adrenals/Urinary Tract: Adrenal glands are unremarkable. Kidneys are normal, without renal calculi, solid lesion, or hydronephrosis. Bladder is unremarkable. Stomach/Bowel: Stomach is within normal limits. Appendix appears normal. Descending and sigmoid diverticula. Focal wall thickening and fat stranding about a diverticulum of the  mid descending colon (series 2, image 42). Vascular/Lymphatic: No significant vascular findings are present. No enlarged abdominal or pelvic lymph nodes. Reproductive: No mass or other significant abnormality. Corpus luteum of the left ovary. Other: No abdominal wall hernia or abnormality. Trace free fluid in the low pelvis. Musculoskeletal: No acute or significant osseous findings. IMPRESSION: 1. Descending and sigmoid diverticula with focal wall thickening and fat stranding about a diverticulum of the mid descending colon, consistent with acute diverticulitis. No evidence of perforation or abscess. 2. Trace free fluid in the low pelvis, likely reactive. 3. Status post cholecystectomy. Electronically Signed   By: Eddie Candle M.D.   On: 10/01/2020 12:59   US PELVIC COMPLETE W TRANSVAGINAL AND TORSION R/O  Result Date: 10/01/2020 CLINICAL DATA:  Severe left lower quadrant abdominal pain EXAM: TRANSABDOMINAL AND TRANSVAGINAL ULTRASOUND OF PELVIS DOPPLER ULTRASOUND OF OVARIES TECHNIQUE: Both transabdominal and transvaginal ultrasound examinations of the pelvis were performed. Transabdominal technique was performed for global imaging of the pelvis including uterus, ovaries, adnexal regions, and pelvic cul-de-sac. It was necessary to proceed with endovaginal exam following the transabdominal exam to visualize the uterus, endometrium, ovaries, and adnexa. Color and duplex Doppler ultrasound was utilized to evaluate blood flow to the ovaries. COMPARISON:  None. FINDINGS: Uterus Measurements: 7.2 x 4.1 x 4.5 cm = volume: 69 mL. No fibroids or other mass visualized. Endometrium Thickness: 2 mm.  No focal abnormality visualized. Right ovary Measurements: 3.0 x 1.1 x 1.5 cm = volume: 3 mL. Normal appearance/no adnexal mass. Subcentimeter follicles. Left ovary Measurements: 2.4 x 1.6 x 3.6 cm = volume: 8 mL. Normal appearance/no adnexal mass. Corpus luteum of the left ovary. Pulsed Doppler evaluation of both ovaries  demonstrates normal low-resistance arterial and venous waveforms. Other findings Trace free fluid in the low pelvis. IMPRESSION: 1. No ultrasound abnormality of the pelvis to explain left lower quadrant pain. 2. Corpus luteum of the left ovary. 3. Trace, nonspecific free fluid in the low pelvis, likely functional in the reproductive age setting. Electronically Signed   By: Eddie Candle M.D.   On: 10/01/2020 12:55    EKG None  Radiology No results found.  Procedures Procedures   Medications Ordered in ED Medications - No data to display  ED Course  I have reviewed the triage vital signs and the nursing notes.  Pertinent labs & imaging results that were available during my care of the patient were reviewed by me and considered in my medical decision making (see chart for details).    MDM Rules/Calculators/A&P  Labs sent.   Reviewed nursing notes and prior charts for additional history.   Labs reviewed/interpreted by me - k low, 3. Kcl po. Lipase normal.  Ua neg for uti.   CT from [facility] system from earlier today reviewed on Care everywhere - no acute process noted, diverticula, no diverticulitis.   Po fluids.   Acetaminophen po.    Final Clinical Impression(s) / ED Diagnoses Final diagnoses:  None    Rx / DC Orders ED Discharge Orders     None        Lajean Saver, MD 10/19/20 2205

## 2020-10-19 NOTE — ED Triage Notes (Addendum)
Pt c/o abd pain, pain to bilat LE, chills-states she was seen here and by PCP for abd pain-last CT scan was today/outpt-pt also states she has a "strange feeling in my chest"-denies as chest pain-NAD-steady gait

## 2021-10-25 ENCOUNTER — Encounter (HOSPITAL_BASED_OUTPATIENT_CLINIC_OR_DEPARTMENT_OTHER): Payer: Self-pay

## 2021-10-25 ENCOUNTER — Emergency Department (HOSPITAL_BASED_OUTPATIENT_CLINIC_OR_DEPARTMENT_OTHER): Payer: BC Managed Care – PPO

## 2021-10-25 ENCOUNTER — Emergency Department (HOSPITAL_BASED_OUTPATIENT_CLINIC_OR_DEPARTMENT_OTHER)
Admission: EM | Admit: 2021-10-25 | Discharge: 2021-10-25 | Disposition: A | Discharge status: Home / Self Care | Payer: BC Managed Care – PPO | Attending: Emergency Medicine | Admitting: Emergency Medicine

## 2021-10-25 DIAGNOSIS — E876 Hypokalemia: Secondary | ICD-10-CM | POA: Insufficient documentation

## 2021-10-25 DIAGNOSIS — R1032 Left lower quadrant pain: Secondary | ICD-10-CM | POA: Diagnosis present

## 2021-10-25 DIAGNOSIS — Z91040 Latex allergy status: Secondary | ICD-10-CM | POA: Insufficient documentation

## 2021-10-25 DIAGNOSIS — K5792 Diverticulitis of intestine, part unspecified, without perforation or abscess without bleeding: Secondary | ICD-10-CM | POA: Insufficient documentation

## 2021-10-25 DIAGNOSIS — R102 Pelvic and perineal pain: Secondary | ICD-10-CM

## 2021-10-25 DIAGNOSIS — R109 Unspecified abdominal pain: Secondary | ICD-10-CM

## 2021-10-25 LAB — URINALYSIS, ROUTINE W REFLEX MICROSCOPIC
Glucose, UA: NEGATIVE mg/dL
Ketones, ur: NEGATIVE mg/dL
Leukocytes,Ua: NEGATIVE
Nitrite: NEGATIVE
Protein, ur: 30 mg/dL — AB
Specific Gravity, Urine: >=1.03 (ref 1.005–1.030)
pH: 5.5 (ref 5.0–8.0)

## 2021-10-25 LAB — URINALYSIS, MICROSCOPIC (REFLEX)

## 2021-10-25 LAB — COMPREHENSIVE METABOLIC PANEL WITH GFR
ALT: 13 U/L (ref 0–44)
AST: 14 U/L — ABNORMAL LOW (ref 15–41)
Albumin: 3.3 g/dL — ABNORMAL LOW (ref 3.5–5.0)
Alkaline Phosphatase: 75 U/L (ref 38–126)
Anion gap: 4 — ABNORMAL LOW (ref 5–15)
BUN: 8 mg/dL (ref 6–20)
CO2: 25 mmol/L (ref 22–32)
Calcium: 8.6 mg/dL — ABNORMAL LOW (ref 8.9–10.3)
Chloride: 110 mmol/L (ref 98–111)
Creatinine, Ser: 0.62 mg/dL (ref 0.44–1.00)
GFR, Estimated: >60 mL/min
Glucose, Bld: 95 mg/dL (ref 70–99)
Potassium: 3.4 mmol/L — ABNORMAL LOW (ref 3.5–5.1)
Sodium: 139 mmol/L (ref 135–145)
Total Bilirubin: 0.5 mg/dL (ref 0.3–1.2)
Total Protein: 6.6 g/dL (ref 6.5–8.1)

## 2021-10-25 LAB — CBC WITH DIFFERENTIAL/PLATELET
Abs Immature Granulocytes: 0.03 10*3/uL (ref 0.00–0.07)
Basophils Absolute: 0 10*3/uL (ref 0.0–0.1)
Basophils Relative: 0 %
Eosinophils Absolute: 0.1 10*3/uL (ref 0.0–0.5)
Eosinophils Relative: 1 %
HCT: 41.1 % (ref 36.0–46.0)
Hemoglobin: 14.2 g/dL (ref 12.0–15.0)
Immature Granulocytes: 0 %
Lymphocytes Relative: 17 %
Lymphs Abs: 1.8 10*3/uL (ref 0.7–4.0)
MCH: 31.7 pg (ref 26.0–34.0)
MCHC: 34.5 g/dL (ref 30.0–36.0)
MCV: 91.7 fL (ref 80.0–100.0)
Monocytes Absolute: 0.9 10*3/uL (ref 0.1–1.0)
Monocytes Relative: 9 %
Neutro Abs: 7.6 10*3/uL (ref 1.7–7.7)
Neutrophils Relative %: 73 %
Platelets: 286 10*3/uL (ref 150–400)
RBC: 4.48 MIL/uL (ref 3.87–5.11)
RDW: 12.8 % (ref 11.5–15.5)
WBC: 10.4 10*3/uL (ref 4.0–10.5)
nRBC: 0 % (ref 0.0–0.2)

## 2021-10-25 LAB — PREGNANCY, URINE: Preg Test, Ur: NEGATIVE

## 2021-10-25 LAB — LIPASE, BLOOD: Lipase: 27 U/L (ref 11–51)

## 2021-10-25 MED ORDER — ONDANSETRON 4 MG PO TBDP: 4.0000 mg | ORAL_TABLET | Freq: Three times a day (TID) | ORAL | 0 refills | Status: DC | PRN

## 2021-10-25 MED ORDER — METRONIDAZOLE 500 MG PO TABS: 500.0000 mg | ORAL_TABLET | Freq: Three times a day (TID) | ORAL | 0 refills | Status: DC

## 2021-10-25 MED ORDER — SODIUM CHLORIDE 0.9 % IV SOLN: INTRAVENOUS | Status: DC

## 2021-10-25 MED ORDER — SODIUM CHLORIDE 0.9 % IV BOLUS
1000.0000 mL | Freq: Once | INTRAVENOUS | Status: AC
  Administered 2021-10-25: 1000 mL via INTRAVENOUS

## 2021-10-25 MED ORDER — CIPROFLOXACIN HCL 500 MG PO TABS: 500.0000 mg | ORAL_TABLET | Freq: Two times a day (BID) | ORAL | 0 refills | Status: AC

## 2021-10-25 MED ORDER — FENTANYL CITRATE PF 50 MCG/ML IJ SOSY
50.0000 ug | PREFILLED_SYRINGE | Freq: Once | INTRAMUSCULAR | Status: AC
  Administered 2021-10-25: 50 ug via INTRAVENOUS
  Filled 2021-10-25: qty 1

## 2021-10-25 MED ORDER — ONDANSETRON HCL 4 MG/2ML IJ SOLN
4.0000 mg | Freq: Once | INTRAMUSCULAR | Status: AC
  Administered 2021-10-25: 4 mg via INTRAVENOUS
  Filled 2021-10-25: qty 2

## 2021-10-25 MED ORDER — IOHEXOL 300 MG/ML  SOLN
100.0000 mL | Freq: Once | INTRAMUSCULAR | Status: AC | PRN
  Administered 2021-10-25: 100 mL via INTRAVENOUS

## 2021-10-25 MED ORDER — HYDROCODONE-ACETAMINOPHEN 5-325 MG PO TABS: 2.0000 | ORAL_TABLET | ORAL | 0 refills | Status: DC | PRN

## 2021-10-25 NOTE — Discharge Instructions (Addendum)
Take two antibiotics for diverticulitis.  Do not drink alcohol on Flagyl.  Return to the emergency department for worsening symptoms.

## 2021-10-25 NOTE — ED Notes (Signed)
Back from CT

## 2021-10-25 NOTE — ED Notes (Signed)
Up to ambulate to BR

## 2021-10-25 NOTE — ED Provider Notes (Signed)
[facility] Provider Note   CSN: [id number] Arrival date & time: 10/25/21  0825     History  Chief Complaint  Patient presents with   Abdominal Pain    Toni Fields is a 36 y.o. female.   Abdominal Pain Associated symptoms: nausea and vaginal bleeding     36 year old female with medical history significant for IBS, hiatal hernia, migraine headaches, endometriosis, interstitial cystitis, pancreatitis, ovarian cyst/ruptured ovarian cyst, dysmenorrhea, HPV, depression, diverticulitis who presents to the emergency with left lower quadrant abdominal pain.  The patient states that she has had left lower quadrant abdominal pain radiating to the back and hips.  She thinks that she has started her menstrual period and endorses vaginal bleeding.  She is also endorsing some pelvic cramping.  She states that she has a history of ruptured ovarian cyst and feels that symptoms are similar.  She denies any fevers or chills.  She is tolerating oral intake.  She endorses mild nausea.  She states her last bowel movement was Friday and is unsure if she is passing gas.  She denies any dysuria or increased urinary frequency.  She denies any vaginal discharge, is not concerned for STIs.  Home Medications Prior to Admission medications   Medication Sig Start Date End Date Taking? Authorizing Provider  ciprofloxacin (CIPRO) 500 MG tablet Take 1 tablet (500 mg total) by mouth every 12 (twelve) hours for 7 days. 10/25/21 11/01/21 Yes Regan Lemming, MD  HYDROcodone-acetaminophen (NORCO/VICODIN) 5-325 MG tablet Take 2 tablets by mouth every 4 (four) hours as needed. 10/25/21  Yes Regan Lemming, MD  metroNIDAZOLE (FLAGYL) 500 MG tablet Take 1 tablet (500 mg total) by mouth 3 (three) times daily. 10/25/21  Yes Regan Lemming, MD  ondansetron (ZOFRAN-ODT) 4 MG disintegrating tablet Take 1 tablet (4 mg total) by mouth every 8 (eight) hours as needed for nausea or vomiting. 10/25/21  Yes  Regan Lemming, MD  Fremanezumab-vfrm (AJOVY) 225 MG/1.5ML SOAJ Inject 225 mg into the skin every 30 (thirty) days. 10/30/19   Melvenia Beam, MD  Meth-Hyo-M [place] Hatter Phos-Ph Sal (URIBEL) 118 MG CAPS TAKE 1 CAPSULE BY MOUTH EVERY DAY AS NEEDED 03/22/18   [provider]  methylPREDNISolone (MEDROL DOSEPAK) 4 MG TBPK tablet Take all pills daily in the morning with food for 6 days. 10/30/19   Melvenia Beam, MD  Multiple Vitamin (MULTIVITAMIN PO) Take by mouth daily.    [provider]  norethindrone (MICRONOR,CAMILA,ERRIN) 0.35 MG tablet Take by mouth. 10/06/17   [provider]  potassium chloride SA (KLOR-CON) 20 MEQ tablet Take one tablet bid x 3 days, then one tablet once a day 10/19/20   Lajean Saver, MD  rizatriptan (MAXALT-MLT) 10 MG disintegrating tablet Take 1 tablet (10 mg total) by mouth as needed for migraine. May repeat in 2 hours if needed 10/30/19   Melvenia Beam, MD      Allergies    Amoxicillin-pot clavulanate, Sulfa antibiotics, and Latex    Review of Systems   Review of Systems  Gastrointestinal:  Positive for abdominal pain and nausea.  Genitourinary:  Positive for vaginal bleeding.  All other systems reviewed and are negative.   Physical Exam Updated Vital Signs BP (!) 115/91   Pulse 80   Temp 98.1 F (36.7 C) (Oral)   Resp 16   Ht '5\' 6"'$  (1.676 m)   Wt 97.5 kg   LMP 10/22/2021 (Approximate) Comment: neg upreg in er today.  SpO2 100%   BMI  34.70 kg/m  Physical Exam Vitals and nursing note reviewed.  Constitutional:      General: She is not in acute distress.    Appearance: She is well-developed.  HENT:     Head: Normocephalic and atraumatic.  Eyes:     Conjunctiva/sclera: Conjunctivae normal.  Cardiovascular:     Rate and Rhythm: Normal rate and regular rhythm.     Heart sounds: No murmur heard. Pulmonary:     Effort: Pulmonary effort is normal. No respiratory distress.     Breath sounds: Normal breath sounds.  Abdominal:      Palpations: Abdomen is soft.     Tenderness: There is abdominal tenderness in the left lower quadrant. There is left CVA tenderness and guarding. There is no right CVA tenderness.  Genitourinary:    Comments: Deferred after discussion with patient Musculoskeletal:        General: No swelling.     Cervical back: Neck supple.  Skin:    General: Skin is warm and dry.     Capillary Refill: Capillary refill takes less than 2 seconds.  Neurological:     Mental Status: She is alert.  Psychiatric:        Mood and Affect: Mood normal.     ED Results / Procedures / Treatments   Labs (all labs ordered are listed, but only abnormal results are displayed) Labs Reviewed  COMPREHENSIVE METABOLIC PANEL - Abnormal; Notable for the following components:      Result Value   Potassium 3.4 (*)    Calcium 8.6 (*)    Albumin 3.3 (*)    AST 14 (*)    Anion gap 4 (*)    All other components within normal limits  URINALYSIS, ROUTINE W REFLEX MICROSCOPIC - Abnormal; Notable for the following components:   APPearance CLOUDY (*)    Hgb urine dipstick LARGE (*)    Bilirubin Urine SMALL (*)    Protein, ur 30 (*)    All other components within normal limits  URINALYSIS, MICROSCOPIC (REFLEX) - Abnormal; Notable for the following components:   Bacteria, UA MANY (*)    All other components within normal limits  LIPASE, BLOOD  CBC WITH DIFFERENTIAL/PLATELET  PREGNANCY, URINE    EKG None  Radiology CT ABDOMEN PELVIS W CONTRAST  Result Date: 10/25/2021 CLINICAL DATA:  Left lower quadrant abdominal pain radiating to the back and hips EXAM: CT ABDOMEN AND PELVIS WITH CONTRAST TECHNIQUE: Multidetector CT imaging of the abdomen and pelvis was performed using the standard protocol following bolus administration of intravenous contrast. RADIATION DOSE REDUCTION: This exam was performed according to the departmental dose-optimization program which includes automated exposure control, adjustment of the mA  and/or kV according to patient size and/or use of iterative reconstruction technique. CONTRAST:  145m OMNIPAQUE IOHEXOL 300 MG/ML  SOLN COMPARISON:  10/01/2020 FINDINGS: Lower chest: Normal Hepatobiliary: Liver parenchyma is normal. Previous cholecystectomy. Pancreas: Normal Spleen: Normal Adrenals/Urinary Tract: Adrenal glands are normal. Kidneys are normal. Bladder is normal. Stomach/Bowel: Stomach and small intestine are normal. There is diverticulosis of the left colon with acute diverticulitis at the descending sigmoid junction. Edema in the fat without evidence of abscess or free air. Vascular/Lymphatic: Aorta and IVC are normal.  No adenopathy. Reproductive: Normal Other: No free fluid or air.  No intraperitoneal abscess. Musculoskeletal: Normal IMPRESSION: Diverticulosis of the left colon. Diverticulitis at the descending/sigmoid junction with edema in the fat but no evidence of abscess or free air. Electronically Signed   By: MNelson Chimes  M.D.   On: 10/25/2021 10:33    Procedures Procedures    Medications Ordered in ED Medications  sodium chloride 0.9 % bolus 1,000 mL (0 mLs Intravenous Stopped 10/25/21 1110)    And  0.9 %  sodium chloride infusion ( Intravenous Not Given 10/25/21 1110)  fentaNYL (SUBLIMAZE) injection 50 mcg (50 mcg Intravenous Given 10/25/21 0942)  ondansetron (ZOFRAN) injection 4 mg (4 mg Intravenous Given 10/25/21 0940)  iohexol (OMNIPAQUE) 300 MG/ML solution 100 mL (100 mLs Intravenous Contrast Given 10/25/21 1015)    ED Course/ Medical Decision Making/ A&P                           Medical Decision Making Amount and/or Complexity of Data Reviewed Labs: ordered. Radiology: ordered.  Risk Prescription drug management.   36 year old female with medical history significant for IBS, hiatal hernia, migraine headaches, endometriosis, interstitial cystitis, pancreatitis, ovarian cyst/ruptured ovarian cyst, dysmenorrhea, HPV, depression, diverticulitis who presents to  the emergency with left lower quadrant abdominal pain.  The patient states that she has had left lower quadrant abdominal pain radiating to the back and hips.  She thinks that she has started her menstrual period and endorses vaginal bleeding.  She is also endorsing some pelvic cramping.  She states that she has a history of ruptured ovarian cyst and feels that symptoms are similar.  She denies any fevers or chills.  She is tolerating oral intake.  She endorses mild nausea.  She states her last bowel movement was Friday and is unsure if she is passing gas.  She denies any dysuria or increased urinary frequency.  She denies any vaginal discharge, is not concerned for STIs.  On arrival, the patient was afebrile, hemodynamically stable, not tachycardic or tachypneic, normotensive, saturating well on room air.  Physical exam significant for left lower quadrant tenderness to palpation, mild guarding, left CVA tenderness present.  Differential diagnosis includes ruptured ovarian cyst, ovarian torsion, diverticulitis, small bowel obstruction, nephrolithiasis, pyelonephritis.  IV access was obtained and the patient was administered an IV fluid bolus, IV fentanyl and IV Zofran.  CT abdomen pelvis was obtained in addition to screening labs.  CT of the abdomen pelvis revealed: IMPRESSION:  Diverticulosis of the left colon. Diverticulitis at the  descending/sigmoid junction with edema in the fat but no evidence of  abscess or free air.    On repeat assessment, the patient was feeling symptomatically improved following the above interventions.  Her abdomen is soft, no rebound or guarding, she has left-sided abdominal tenderness to palpation on repeat assessment.  Laboratory evaluation significant for lipase normal, CMP with mild hypokalemia 3.4, no other significant abnormality, urinalysis with many bacteria, negative nitrites, negative leukocytes, 0-5 WBCs.  The patient has no dysuria or increased urinary  frequency.  Her urine pregnancy is negative.  Her CBC was without a leukocytosis or anemia. She has minimal pelvic tenderness.  She declined pelvic exam in the ED after discussion of findings on CT. given CT findings, do not think further imaging work-up with ultrasound is warranted at this time as patient's symptoms appear to be more localized to the left upper quadrant left side of her abdomen with minimal left pelvic pain.  Lower suspicion for acute ovarian pathology given the finding of diverticulitis at the sigmoid junction.  We will plan to treat symptomatically and treat with antibiotics. Return precautions provided.  Final Clinical Impression(s) / ED Diagnoses Final diagnoses:  Abdominal pain, unspecified abdominal location  Pelvic pain in female  Diverticulitis    Rx / DC Orders ED Discharge Orders          Ordered    ciprofloxacin (CIPRO) 500 MG tablet  Every 12 hours        10/25/21 1049    metroNIDAZOLE (FLAGYL) 500 MG tablet  3 times daily        10/25/21 1049    HYDROcodone-acetaminophen (NORCO/VICODIN) 5-325 MG tablet  Every 4 hours PRN        10/25/21 1049    ondansetron (ZOFRAN-ODT) 4 MG disintegrating tablet  Every 8 hours PRN        10/25/21 1050              Regan Lemming, MD 10/25/21 1120

## 2021-10-25 NOTE — ED Triage Notes (Signed)
C/o LLQ abdominal pain radiating to back and hips. Vaginal bleeding. Hx of endometriosis, diverticulitis and pancreatitis. Last BM Friday.

## 2021-10-25 NOTE — ED Notes (Signed)
Pt found ride home

## 2022-04-10 IMAGING — CT CT ABD-PELV W/ CM
2 of 4 series · 17 of 46 positions shown, 19 images · IV contrast (Omnipaque)
Comparison: 08/08/2019

CLINICAL DATA: Left lower quadrant pain

EXAM:
CT ABDOMEN AND PELVIS WITH CONTRAST
TECHNIQUE: Multidetector CT imaging of the abdomen and pelvis was performed
using the standard protocol following bolus administration of
intravenous contrast.
CONTRAST:  100mL OMNIPAQUE IOHEXOL 300 MG/ML  SOLN

[Series 2: axial st · axial · 0.98mm/px · z∈[-446,+4]mm · 14 of 100 slices shown, 16 images]
[im 5/100  soft-tissue]
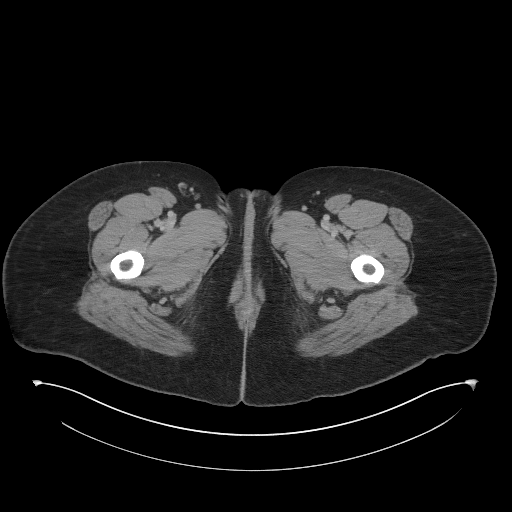
[im 5/100  bone]
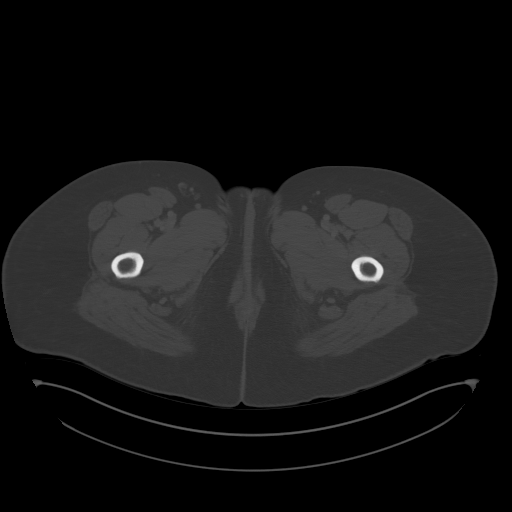
[im 13/100  soft-tissue]
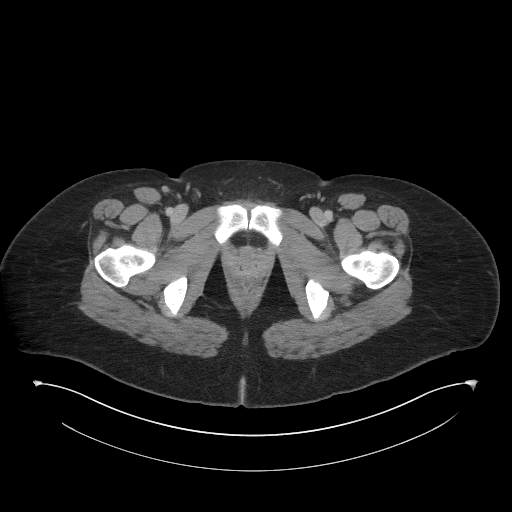
[im 21/100  soft-tissue]
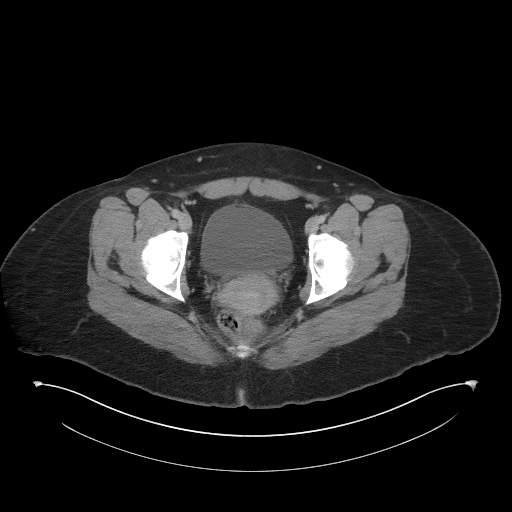
[im 25/100  soft-tissue]
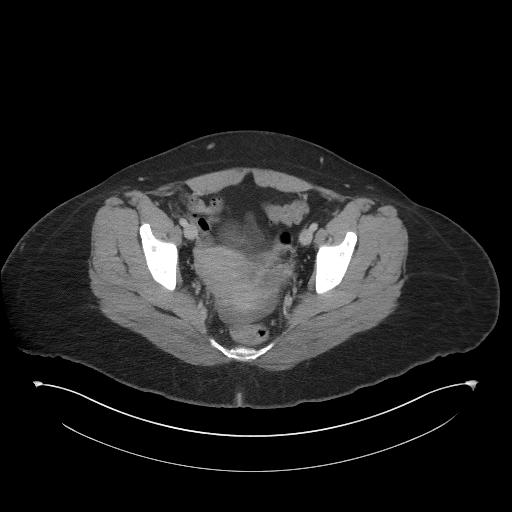
[im 34/100  soft-tissue]
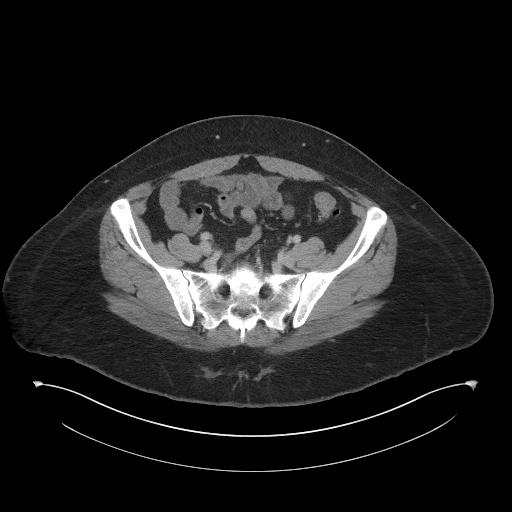
[im 42/100  soft-tissue]
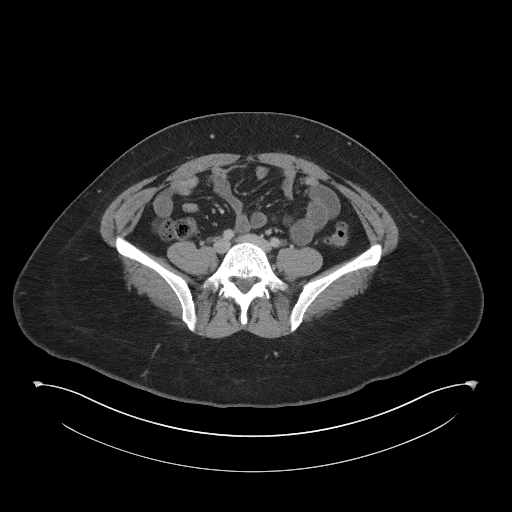
[im 46/100  soft-tissue]
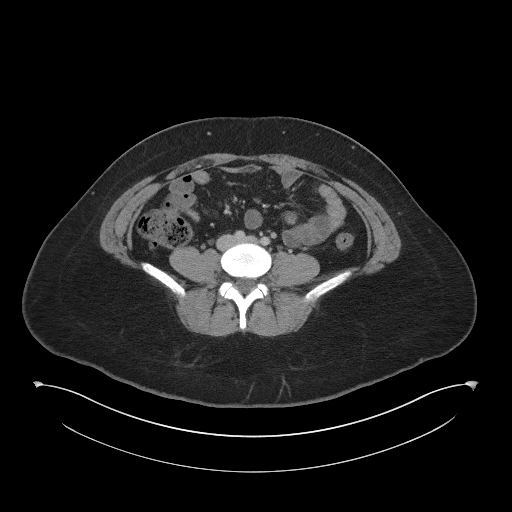
[im 54/100  soft-tissue]
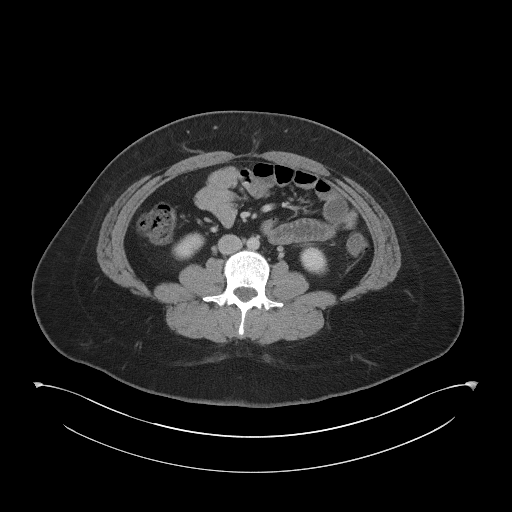
[im 58/100  soft-tissue]
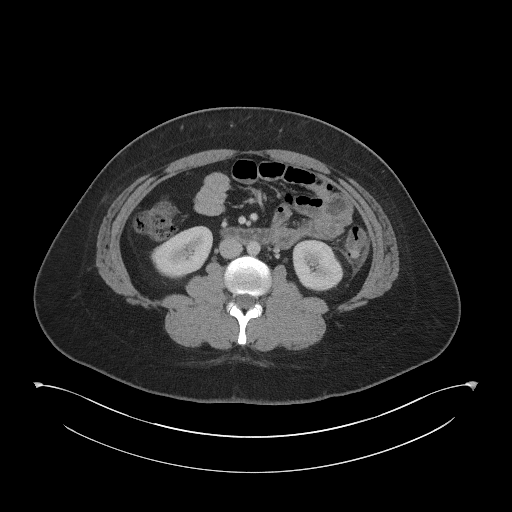
[im 58/100  bone]
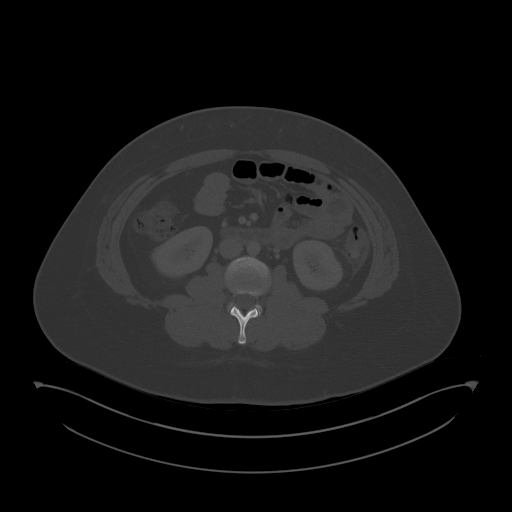
[im 67/100  soft-tissue]
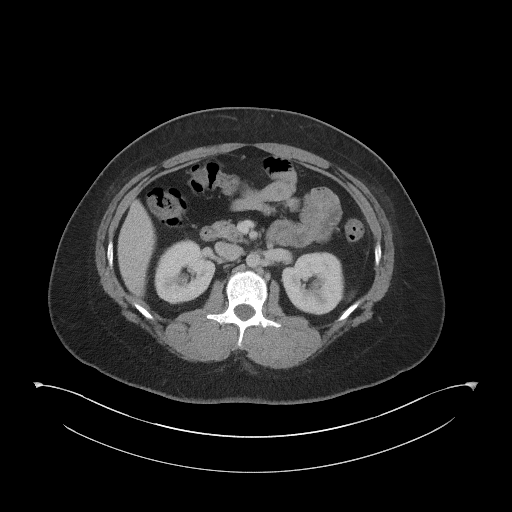
[im 75/100  soft-tissue]
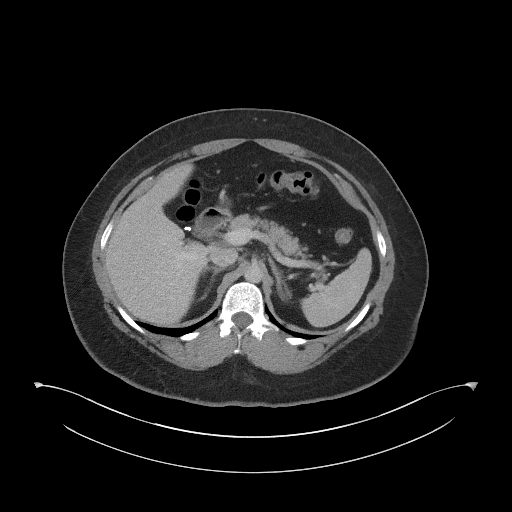
[im 79/100  soft-tissue]
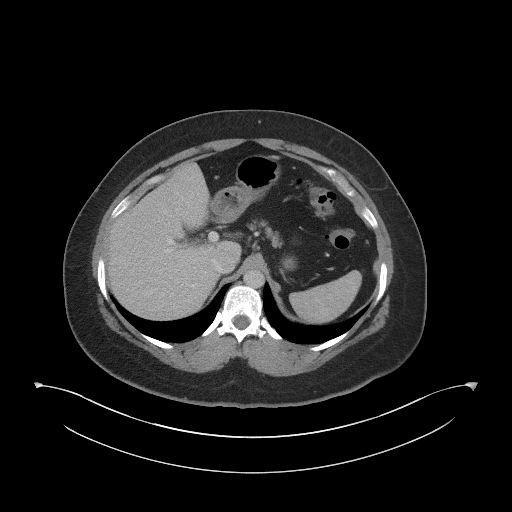
[im 87/100  soft-tissue]
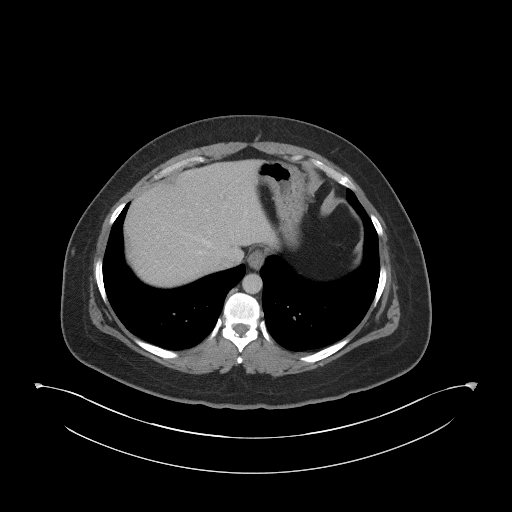
[im 95/100  soft-tissue]
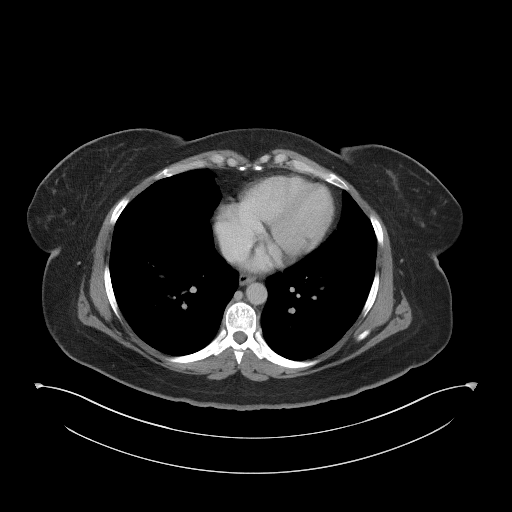

[Series 5: coronal st · coronal · 0.94mm/px · 3 of 101 slices shown]
[im 34/101  soft-tissue]
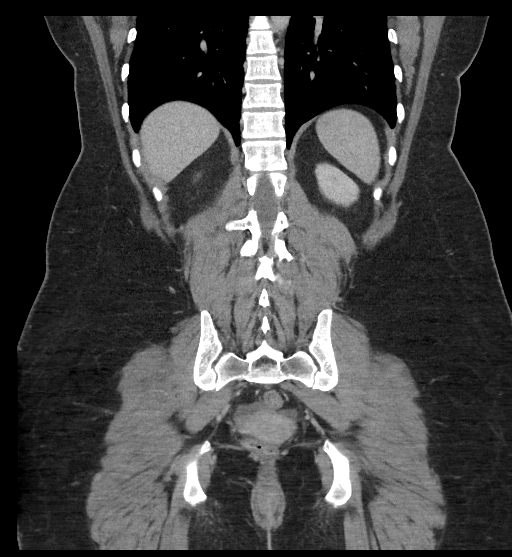
[im 45/101  soft-tissue]
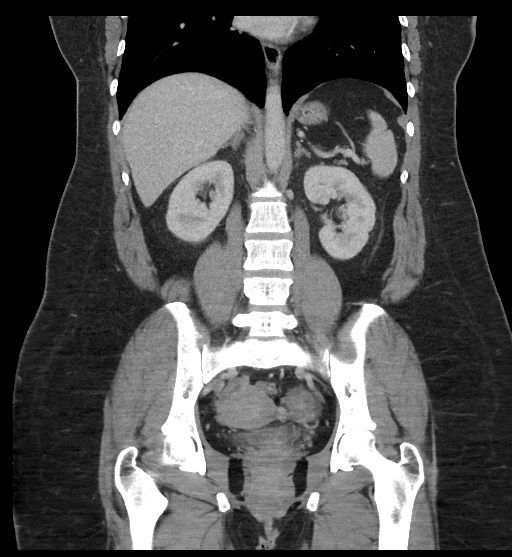
[im 56/101  soft-tissue]
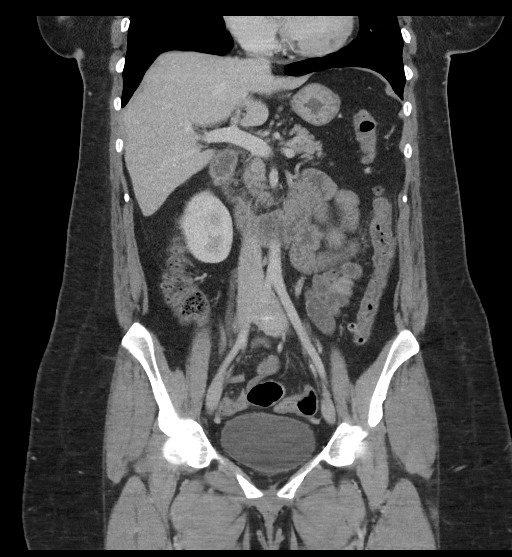

[17 of 46 positions shown; findings below may reference images not displayed]

FINDINGS: Lower chest: No acute abnormality.

Hepatobiliary: No focal liver abnormality is seen. Status post
cholecystectomy. No biliary dilatation.

Pancreas: Unremarkable. No pancreatic ductal dilatation or
surrounding inflammatory changes.

Spleen: Normal in size without significant abnormality.

Adrenals/Urinary Tract: Adrenal glands are unremarkable. Kidneys are
normal, without renal calculi, solid lesion, or hydronephrosis.
Bladder is unremarkable.

Stomach/Bowel: Stomach is within normal limits. Appendix appears
normal. Descending and sigmoid diverticula. Focal wall thickening
and fat stranding about a diverticulum of the mid descending colon
(series 2, image 42).

Vascular/Lymphatic: No significant vascular findings are present. No
enlarged abdominal or pelvic lymph nodes.

Reproductive: No mass or other significant abnormality. Corpus
luteum of the left ovary.

Other: No abdominal wall hernia or abnormality. Trace free fluid in
the low pelvis.

Musculoskeletal: No acute or significant osseous findings.
IMPRESSION: 1. Descending and sigmoid diverticula with focal wall thickening and
fat stranding about a diverticulum of the mid descending colon,
consistent with acute diverticulitis. No evidence of perforation or
abscess.
2. Trace free fluid in the low pelvis, likely reactive.
3. Status post cholecystectomy.

## 2022-06-09 ENCOUNTER — Other Ambulatory Visit: Payer: Self-pay

## 2022-06-09 ENCOUNTER — Emergency Department (HOSPITAL_BASED_OUTPATIENT_CLINIC_OR_DEPARTMENT_OTHER): Payer: BC Managed Care – PPO | Admitting: Radiology

## 2022-06-09 ENCOUNTER — Emergency Department (HOSPITAL_BASED_OUTPATIENT_CLINIC_OR_DEPARTMENT_OTHER): Payer: BC Managed Care – PPO

## 2022-06-09 ENCOUNTER — Emergency Department (HOSPITAL_BASED_OUTPATIENT_CLINIC_OR_DEPARTMENT_OTHER)
Admission: EM | Admit: 2022-06-09 | Discharge: 2022-06-10 | Disposition: A | Discharge status: Home / Self Care | Payer: BC Managed Care – PPO | Attending: Emergency Medicine | Admitting: Emergency Medicine

## 2022-06-09 ENCOUNTER — Encounter (HOSPITAL_BASED_OUTPATIENT_CLINIC_OR_DEPARTMENT_OTHER): Payer: Self-pay | Admitting: Emergency Medicine

## 2022-06-09 DIAGNOSIS — G43809 Other migraine, not intractable, without status migrainosus: Secondary | ICD-10-CM | POA: Insufficient documentation

## 2022-06-09 DIAGNOSIS — Z91040 Latex allergy status: Secondary | ICD-10-CM | POA: Insufficient documentation

## 2022-06-09 DIAGNOSIS — Y92410 Unspecified street and highway as the place of occurrence of the external cause: Secondary | ICD-10-CM | POA: Insufficient documentation

## 2022-06-09 DIAGNOSIS — R0789 Other chest pain: Secondary | ICD-10-CM

## 2022-06-09 DIAGNOSIS — R079 Chest pain, unspecified: Secondary | ICD-10-CM | POA: Diagnosis present

## 2022-06-09 MED ORDER — KETOROLAC TROMETHAMINE 30 MG/ML IJ SOLN
30.0000 mg | Freq: Once | INTRAMUSCULAR | Status: AC
  Administered 2022-06-10: 30 mg via INTRAMUSCULAR
  Filled 2022-06-09: qty 1
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
## 2022-06-10 MED ORDER — METHOCARBAMOL 500 MG PO TABS: 500.0000 mg | ORAL_TABLET | Freq: Two times a day (BID) | ORAL | 0 refills | Status: DC

## 2022-06-10 MED ORDER — NAPROXEN 500 MG PO TABS: 500.0000 mg | ORAL_TABLET | Freq: Two times a day (BID) | ORAL | 0 refills | Status: DC

## 2022-06-10 NOTE — ED Notes (Signed)
RN reviewed discharge instructions with pt. Pt verbalized understanding and had no further questions. VSS upon discharge.  

## 2023-02-16 ENCOUNTER — Emergency Department (HOSPITAL_BASED_OUTPATIENT_CLINIC_OR_DEPARTMENT_OTHER)
Admission: EM | Admit: 2023-02-16 | Discharge: 2023-02-16 | Disposition: A | Discharge status: Home / Self Care | Payer: BC Managed Care – PPO | Attending: Emergency Medicine | Admitting: Emergency Medicine

## 2023-02-16 ENCOUNTER — Encounter (HOSPITAL_BASED_OUTPATIENT_CLINIC_OR_DEPARTMENT_OTHER): Payer: Self-pay | Admitting: Emergency Medicine

## 2023-02-16 ENCOUNTER — Other Ambulatory Visit: Payer: Self-pay

## 2023-02-16 ENCOUNTER — Emergency Department (HOSPITAL_BASED_OUTPATIENT_CLINIC_OR_DEPARTMENT_OTHER): Payer: BC Managed Care – PPO

## 2023-02-16 DIAGNOSIS — R519 Headache, unspecified: Secondary | ICD-10-CM | POA: Insufficient documentation

## 2023-02-16 DIAGNOSIS — R079 Chest pain, unspecified: Secondary | ICD-10-CM | POA: Diagnosis present

## 2023-02-16 DIAGNOSIS — Z20822 Contact with and (suspected) exposure to covid-19: Secondary | ICD-10-CM | POA: Insufficient documentation

## 2023-02-16 DIAGNOSIS — R110 Nausea: Secondary | ICD-10-CM | POA: Insufficient documentation

## 2023-02-16 DIAGNOSIS — R0602 Shortness of breath: Secondary | ICD-10-CM | POA: Insufficient documentation

## 2023-02-16 DIAGNOSIS — Z91040 Latex allergy status: Secondary | ICD-10-CM | POA: Diagnosis not present

## 2023-02-16 LAB — D-DIMER, QUANTITATIVE: D-Dimer, Quant: <0.27 ug{FEU}/mL (ref 0.00–0.50)

## 2023-02-16 LAB — CBC
HCT: 40.3 % (ref 36.0–46.0)
Hemoglobin: 13.9 g/dL (ref 12.0–15.0)
MCH: 31.6 pg (ref 26.0–34.0)
MCHC: 34.5 g/dL (ref 30.0–36.0)
MCV: 91.6 fL (ref 80.0–100.0)
Platelets: 316 10*3/uL (ref 150–400)
RBC: 4.4 MIL/uL (ref 3.87–5.11)
RDW: 12.4 % (ref 11.5–15.5)
WBC: 10.1 10*3/uL (ref 4.0–10.5)
nRBC: 0 % (ref 0.0–0.2)

## 2023-02-16 LAB — RESP PANEL BY RT-PCR (RSV, FLU A&B, COVID)  RVPGX2
Influenza A by PCR: NEGATIVE
Influenza B by PCR: NEGATIVE
Resp Syncytial Virus by PCR: NEGATIVE
SARS Coronavirus 2 by RT PCR: NEGATIVE

## 2023-02-16 LAB — BASIC METABOLIC PANEL
Anion gap: 7 (ref 5–15)
BUN: 14 mg/dL (ref 6–20)
CO2: 20 mmol/L — ABNORMAL LOW (ref 22–32)
Calcium: 8.6 mg/dL — ABNORMAL LOW (ref 8.9–10.3)
Chloride: 107 mmol/L (ref 98–111)
Creatinine, Ser: 0.8 mg/dL (ref 0.44–1.00)
GFR, Estimated: >60 mL/min (ref >60)
Glucose, Bld: 109 mg/dL — ABNORMAL HIGH (ref 70–99)
Potassium: 3.3 mmol/L — ABNORMAL LOW (ref 3.5–5.1)
Sodium: 134 mmol/L — ABNORMAL LOW (ref 135–145)

## 2023-02-16 LAB — TROPONIN I (HIGH SENSITIVITY)
Troponin I (High Sensitivity): <2 ng/L (ref <18)
Troponin I (High Sensitivity): 9 ng/L (ref <18)

## 2023-02-16 LAB — HCG, QUANTITATIVE, PREGNANCY: hCG, Beta Chain, Quant, S: <1 m[IU]/mL (ref <5)

## 2023-02-16 MED ORDER — PROMETHAZINE HCL 25 MG/ML IJ SOLN
INTRAMUSCULAR | Status: AC
  Filled 2023-02-16: qty 1

## 2023-02-16 MED ORDER — PROMETHAZINE HCL 25 MG/ML IJ SOLN
12.5000 mg | Freq: Once | INTRAMUSCULAR | Status: AC
  Administered 2023-02-16: 12.5 mg via INTRAVENOUS
  Filled 2023-02-16: qty 0.5

## 2023-02-16 MED ORDER — FENTANYL CITRATE PF 50 MCG/ML IJ SOSY
50.0000 ug | PREFILLED_SYRINGE | Freq: Once | INTRAMUSCULAR | Status: AC
  Administered 2023-02-16: 50 ug via INTRAVENOUS
  Filled 2023-02-16: qty 1

## 2023-02-16 MED ORDER — KETOROLAC TROMETHAMINE 30 MG/ML IJ SOLN
30.0000 mg | Freq: Once | INTRAMUSCULAR | Status: AC
  Administered 2023-02-16: 30 mg via INTRAVENOUS
  Filled 2023-02-16: qty 1

## 2023-02-16 NOTE — ED Triage Notes (Signed)
Chest pain 15 minutes ago , sore throat and headache . Shortness of breath headache  Tingling to fingers  Denies Hx anxiety .

## 2023-02-16 NOTE — ED Provider Notes (Signed)
[facility] Provider Note   CSN: [id number] Arrival date & time: 02/16/23  1704     History  Chief Complaint  Patient presents with   Chest Pain    Toni Fields is a 37 y.o. female with history of IBS, hiatal hernia, Raynaud's, endometriosis, interstitial cystitis, biliary pancreatitis, HPV, depression, diverticulitis, who presents the emergency department complaining of chest pain and headache.  Patient states that she has been vomiting her blood pressure medication recently, and has a follow-up with vascular surgery scheduled for Raynaud's syndrome in her right hand.  She also had migraine headache earlier this week, but did resolve, but started returning today.  She normally has an aura of floaters in her vision.  There is no spectacle migraine cycle.  When she was leaving work she had a sudden sensation of chest pain that radiated to both sides of her chest and up into her throat.  It feels hard to breathe as well.  Pain also worsens with movement and touching the chest.  Denies any recent illness, started coughing today, but having with nausea.  No recent long travel.  No history of blood clots. Is on OCP, and smokes occasionally.    Chest Pain Associated symptoms: headache and shortness of breath        Home Medications Prior to Admission medications   Medication Sig Start Date End Date Taking? Authorizing Provider  Fremanezumab-vfrm (AJOVY) 225 MG/1.5ML SOAJ Inject 225 mg into the skin every 30 (thirty) days. 10/30/19   Anson Fret, MD  HYDROcodone-acetaminophen (NORCO/VICODIN) 5-325 MG tablet Take 2 tablets by mouth every 4 (four) hours as needed. 10/25/21   Ernie Avena, MD  Meth-Hyo-M Bl-Na Phos-Ph Sal (URIBEL) 118 MG CAPS TAKE 1 CAPSULE BY MOUTH EVERY DAY AS NEEDED 03/22/18   [provider]  methocarbamol (ROBAXIN) 500 MG tablet Take 1 tablet (500 mg total) by mouth 2 (two) times daily. 06/10/22   Pollyann Savoy, MD  methylPREDNISolone (MEDROL DOSEPAK) 4 MG TBPK tablet Take all pills daily in the morning with food for 6 days. 10/30/19   Anson Fret, MD  metroNIDAZOLE (FLAGYL) 500 MG tablet Take 1 tablet (500 mg total) by mouth 3 (three) times daily. 10/25/21   Ernie Avena, MD  Multiple Vitamin (MULTIVITAMIN PO) Take by mouth daily.    [provider]  naproxen (NAPROSYN) 500 MG tablet Take 1 tablet (500 mg total) by mouth 2 (two) times daily. 06/10/22   Pollyann Savoy, MD  norethindrone (MICRONOR,CAMILA,ERRIN) 0.35 MG tablet Take by mouth. 10/06/17   [provider]  ondansetron (ZOFRAN-ODT) 4 MG disintegrating tablet Take 1 tablet (4 mg total) by mouth every 8 (eight) hours as needed for nausea or vomiting. 10/25/21   Ernie Avena, MD  potassium chloride SA (KLOR-CON) 20 MEQ tablet Take one tablet bid x 3 days, then one tablet once a day 10/19/20   Cathren Laine, MD  rizatriptan (MAXALT-MLT) 10 MG disintegrating tablet Take 1 tablet (10 mg total) by mouth as needed for migraine. May repeat in 2 hours if needed 10/30/19   Anson Fret, MD      Allergies    Amoxicillin-pot clavulanate, Sulfa antibiotics, and Latex    Review of Systems   Review of Systems  Eyes:  Positive for visual disturbance.  Respiratory:  Positive for shortness of breath.   Cardiovascular:  Positive for chest pain.  Neurological:  Positive for headaches.  All other systems reviewed and are  negative.   Physical Exam Updated Vital Signs BP (!) 123/92   Pulse (!) 59   Temp 98 F (36.7 C)   Resp 19   Wt 101.2 kg   LMP 02/08/2023 (Exact Date)   SpO2 96%   BMI 35.99 kg/m  Physical Exam Vitals and nursing note reviewed.  Constitutional:      Appearance: Normal appearance.  HENT:     Head: Normocephalic and atraumatic.  Eyes:     Conjunctiva/sclera: Conjunctivae normal.  Cardiovascular:     Rate and Rhythm: Normal rate and regular rhythm.     Pulses:          Posterior tibial pulses are  2+ on the right side and 2+ on the left side.  Pulmonary:     Effort: Pulmonary effort is normal. No respiratory distress.     Breath sounds: Normal breath sounds.  Abdominal:     General: There is no distension.     Palpations: Abdomen is soft.     Tenderness: There is no abdominal tenderness.  Musculoskeletal:     Right lower leg: No edema.     Left lower leg: No edema.  Skin:    General: Skin is warm and dry.  Neurological:     General: No focal deficit present.     Mental Status: She is alert.     Comments: Neuro: Speech is clear, able to follow commands.  No facial droop or slurred speech. EOMI. Sensation intact throughout.     ED Results / Procedures / Treatments   Labs (all labs ordered are listed, but only abnormal results are displayed) Labs Reviewed  BASIC METABOLIC PANEL - Abnormal; Notable for the following components:      Result Value   Sodium 134 (*)    Potassium 3.3 (*)    CO2 20 (*)    Glucose, Bld 109 (*)    Calcium 8.6 (*)    All other components within normal limits  RESP PANEL BY RT-PCR (RSV, FLU A&B, COVID)  RVPGX2  CBC  D-DIMER, QUANTITATIVE  HCG, QUANTITATIVE, PREGNANCY  TROPONIN I (HIGH SENSITIVITY)  TROPONIN I (HIGH SENSITIVITY)    EKG EKG Interpretation Date/Time:  Thursday February 16 2023 17:13:18 EST Ventricular Rate:  82 PR Interval:  176 QRS Duration:  84 QT Interval:  388 QTC Calculation: 454 R Axis:   38  Text Interpretation: Sinus rhythm when compared to prior, similar appearance No STEMI Confirmed by Theda Belfast ([id number]) on 02/16/2023 5:14:22 PM  Radiology DG Chest 2 View  Result Date: 02/16/2023 CLINICAL DATA:  Chest pain. EXAM: CHEST - 2 VIEW COMPARISON:  June 09, 2022 FINDINGS: The heart size and mediastinal contours are within normal limits. Both lungs are clear. Radiopaque surgical clips are seen within the right upper quadrant. The visualized skeletal structures are unremarkable. IMPRESSION: No active cardiopulmonary  disease. Electronically Signed   By: Aram Candela M.D.   On: 02/16/2023 20:40    Procedures Procedures    Medications Ordered in ED Medications  promethazine (PHENERGAN) 25 MG/ML injection (  Not Given 02/16/23 1826)  ketorolac (TORADOL) 30 MG/ML injection 30 mg (30 mg Intravenous Given 02/16/23 1747)  promethazine (PHENERGAN) 12.5 mg in sodium chloride 0.9 % 50 mL IVPB (0 mg Intravenous Stopped 02/16/23 1811)  fentaNYL (SUBLIMAZE) injection 50 mcg (50 mcg Intravenous Given 02/16/23 1839)    ED Course/ Medical Decision Making/ A&P             HEART Score: 1  Medical Decision Making Amount and/or Complexity of Data Reviewed Labs: ordered. Radiology: ordered.  Risk Prescription drug management.  This patient is a 37 y.o. female  who presents to the ED for concern of chest pain and headache.   Differential diagnoses prior to evaluation: The emergent differential diagnosis includes, but is not limited to,  ACS, peri/myocarditis, aortic dissection, PE, pneumothorax, esophageal spasm or rupture, chronic angina, pneumonia, bronchitis, GERD, reflux/PUD, biliary disease, pancreatitis, costochondritis, anxiety, stroke, increased ICP, meningitis, CVA, intracranial tumor, venous sinus thrombosis, migraine, cluster headache, hypertension, drug related, head injury, tension headache, sinusitis. This is not an exhaustive differential.   Past Medical History / Co-morbidities / Social History: IBS, hiatal hernia, Raynaud's, endometriosis, interstitial cystitis, biliary pancreatitis, HPV, depression, diverticulitis  Physical Exam: Physical exam performed. The pertinent findings include: Normal vital signs, no acute distress.  Reproducible tenderness to palpation of the anterior chest, with no focal findings.  Heart and lung sounds normal.  Lab Tests/Imaging studies: I personally interpreted labs/imaging and the pertinent results include: CBC and BMP grossly unremarkable.   Negative troponin.  D-dimer.  Negative pregnancy.  Negative respiratory panel.  Chest x-ray without acute abnormalities.  I agree with the radiologist interpretation.  Cardiac monitoring: EKG obtained and interpreted by myself and attending physician which shows: NSR   Medications: I ordered medication including toradol, phenergan, fentanyl.  I have reviewed the patients home medicines and have made adjustments as needed.   Disposition: After consideration of the diagnostic results and the patients response to treatment, I feel that emergency department workup does not suggest an emergent condition requiring admission or immediate intervention beyond what has been performed at this time. Patient is to be discharged with recommendation to follow up with PCP in regards to today's hospital visit. Chest pain is not likely of cardiac or pulmonary etiology d/t presentation, d-dimer for PE negative, VSS, no tracheal deviation, no JVD or new murmur, RRR, breath sounds equal bilaterally, EKG without acute abnormalities, negative troponin, and negative CXR. Heart score of 1. Pt has been advised to return to the ED if CP becomes exertional, associated with diaphoresis or nausea, radiates to left jaw/arm, worsens or becomes concerning in any way. Pt appears reliable for follow up and is agreeable to discharge.   Final Clinical Impression(s) / ED Diagnoses Final diagnoses:  Nonspecific chest pain    Rx / DC Orders ED Discharge Orders     None      Portions of this report may have been transcribed using voice recognition software. Every effort was made to ensure accuracy; however, inadvertent computerized transcription errors may be present.    Jeanella Flattery 02/16/23 2053    Tegeler, Canary Brim, MD 02/16/23 2137

## 2023-02-16 NOTE — Discharge Instructions (Signed)
You were seen in the emergency department today for chest pain.  As we discussed your lab work, EKG, chest x-ray all looked reassuring today.    I recommend monitoring your stress levels.  Continue to monitor how you are doing overall, and return to the emergency department for any new or worsening symptoms such as: Worsening pain or pain with exertion, difficulty breathing, sweating, or pain or swelling in your legs.

## 2023-04-14 ENCOUNTER — Encounter: Payer: Self-pay | Admitting: Internal Medicine

## 2023-04-14 ENCOUNTER — Ambulatory Visit: Payer: 59 | Attending: Internal Medicine | Admitting: Internal Medicine

## 2023-04-14 VITALS — BP 113/79 | HR 79 | Resp 14 | Ht 67.0 in | Wt 220.0 lb

## 2023-04-14 DIAGNOSIS — I7389 Other specified peripheral vascular diseases: Secondary | ICD-10-CM | POA: Insufficient documentation

## 2023-04-14 NOTE — Patient Instructions (Signed)
 The skin changes on your leg look like livedo reticularis, which is usually coming form abnormal constriction or swelling of the blood vessels.  Your finger changes look most like acrocyanosis, which is coming from impaired blood flow through the affected digits. This is somewhat similar with raynaud's but more persistent rather than acute episodes.

## 2023-04-14 NOTE — Progress Notes (Signed)
 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 )   Discussed the use of AI scribe software for clinical note transcription with the patient, who gave verbal consent to proceed.  History of Present Illness   Toni Fields is a 38 y.o. female with a history of migraines and endometriosis, presents with a new onset of peripheral circulation issues primarily affecting the right hand. The symptoms began in September, initially presenting as coldness in the thumb out of proportion to fairly warm weather. 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 Over the following weeks, the patient noticed the thumb turning blue and the skin peeling back. As the weather cooled, the cold sensation spread to the pointer finger. The patient describes a cold pain that radiates up the arm, making it difficult to lift. The pain eventually subsides, but the finger remains cold and discolored, alternating between blue and a pale white. The patient reports difficulty applying pressure with the hand, affecting daily activities such as opening items and typing.  The patient was prescribed nifedipine for the circulation issues, but discontinued the medication due to an increase in migraine frequency. Recently, the patient has also noticed a discoloration in the leg and numbness in the ring toe. The patient denies any history of blood clots, smoking, or use of stimulants other than a daily Pepsi. The patient also reports a history of endometriosis, but it is unclear if this is related to the current symptoms.  The patient has an upcoming appointment with a cardiologist to further investigate these symptoms. The  patient has been managing the symptoms by keeping the hand warm with rechargeable hand warmers and gloves.     Activities of Daily Living:  Patient reports morning stiffness for 0 minute.   Patient Denies nocturnal pain.  Difficulty dressing/grooming: Denies Difficulty climbing stairs: Denies Difficulty getting out of chair: Denies Difficulty using hands for taps, buttons, cutlery, and/or writing: Reports  Review of Systems  Constitutional:  Negative for fatigue.  HENT:  Negative for mouth sores and mouth dryness.   Eyes:  Negative for dryness.  Respiratory:  Negative for shortness of breath.   Cardiovascular:  Negative for chest pain and palpitations.  Gastrointestinal:  Negative for blood in stool, constipation and diarrhea.  Endocrine: Negative for increased urination.  Genitourinary:  Negative for involuntary urination.  Musculoskeletal:  Positive for myalgias and myalgias. Negative for joint pain, gait problem, joint pain, joint swelling, muscle weakness, morning stiffness and muscle tenderness.  Skin:  Negative for color change, rash, hair loss and sensitivity to sunlight.  Allergic/Immunologic: Negative for susceptible to infections.  Neurological:  Positive for dizziness and headaches.  Hematological:  Negative for swollen glands.  Psychiatric/Behavioral:  Positive for depressed mood. Negative for sleep disturbance. The patient is nervous/anxious.     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   Chronic migraine without aura without status migrainosus, not intractable 10/30/2019   Dysmenorrhea 08/02/2011   Endometriosis 08/02/2011   Infertility, female 08/02/2011  Hidradenitis 08/02/2011   Obesity 08/02/2011    Past Medical History:  Diagnosis Date   Abdominal pain    Acute biliary pancreatitis    Depression    Diverticulitis    Dysmenorrhea    Endometriosis    Hernia, hiatal    HPV in female    IBS  (irritable bowel syndrome)    IC (interstitial cystitis)    Migraine    Ovarian cyst    resolved    Family History  Problem Relation Age of Onset   Diabetes Mother    Hypertension Mother    Hyperlipidemia Mother    Breast cancer Mother    Heart disease Mother    Heart disease Father    Heart attack Father    Hypertension Father    Migraines Sister    Breast cancer Maternal Grandmother    Cervical cancer Maternal Grandmother    Ovarian cancer Maternal Grandmother    Diabetes Maternal Grandfather    Cancer Paternal Grandmother    Diabetes Paternal Grandfather    Breast cancer Maternal Aunt    Heart disease Paternal Uncle    Heart disease Paternal Uncle    Diabetes Paternal Uncle    Heart disease Paternal Uncle    Cancer Paternal Uncle        pancreatic   Heart disease Paternal Uncle    Diabetes Paternal Uncle    Colon cancer Neg Hx    Past Surgical History:  Procedure Laterality Date   BLADDER SURGERY  2005   CHOLECYSTECTOMY  2016   CYSTOSCOPY     ERCP  2019   EXPLORATORY LAPAROTOMY  4/12   endometrosis   NASAL ENDOSCOPY     WISDOM TOOTH EXTRACTION     Social History   Social History Narrative   Lives at home alone   Right handed   Caffeine: 1 soda or tea per day    Immunization History  Administered Date(s) Administered   Moderna Sars-Covid-2 Vaccination 08/22/2019, 09/17/2019     Objective: Vital Signs: BP 113/79 (BP Location: Right Arm, Patient Position: Sitting, Cuff Size: Large)   Pulse 79   Resp 14   Ht 5' 7 (1.702 m)   Wt 220 lb (99.8 kg)   LMP 04/03/2023   BMI 34.46 kg/m    Physical Exam Constitutional:      Appearance: She is obese.  Eyes:     Conjunctiva/sclera: Conjunctivae normal.  Cardiovascular:     Rate and Rhythm: Normal rate and regular rhythm.  Pulmonary:     Effort: Pulmonary effort is normal.     Breath sounds: Normal breath sounds.  Musculoskeletal:     Right lower leg: No edema.     Left lower leg: No edema.   Lymphadenopathy:     Cervical: No cervical adenopathy.  Skin:    General: Skin is warm and dry.     Comments: Faintly blue right index finger, refill not grossly delayed on capillary pressure or modified allen's Normal appearing nailfold capillaries No digital pitting or lesions Left calf livedo reticularis  Neurological:     Mental Status: She is alert.  Psychiatric:        Mood and Affect: Mood normal.      Musculoskeletal Exam:  Shoulders full ROM no tenderness or swelling Elbows full ROM no tenderness or swelling Wrists full ROM no tenderness or swelling Fingers full ROM no tenderness or swelling Knees full ROM no tenderness or swelling Ankles full ROM no tenderness or swelling  Investigation: No additional findings.  Imaging: No results found.  Recent Labs: Lab Results  Component Value Date   WBC 10.1 02/16/2023   HGB 13.9 02/16/2023   PLT 316 02/16/2023   NA 134 (L) 02/16/2023   K 3.3 (L) 02/16/2023   CL 107 02/16/2023   CO2 20 (L) 02/16/2023   GLUCOSE 109 (H) 02/16/2023   BUN 14 02/16/2023   CREATININE 0.80 02/16/2023   BILITOT 0.5 10/25/2021   ALKPHOS 75 10/25/2021   AST 14 (L) 10/25/2021   ALT 13 10/25/2021   PROT 6.6 10/25/2021   ALBUMIN 3.3 (L) 10/25/2021   CALCIUM 8.6 (L) 02/16/2023   GFRAA 131 10/30/2019    Speciality Comments: No specialty comments available.  Procedures:  No procedures performed Allergies: Amoxicillin-pot clavulanate, Sulfa antibiotics, and Latex   Assessment / Plan:     Visit Diagnoses: Acrocyanosis (HCC) - Plan: ANA, Beta-2  glycoprotein antibodies, Cardiolipin antibodies, IgG, IgM, IgA, Sedimentation rate, C-reactive protein, C3 and C4, Cryoglobulin Acrocyanosis New onset of cold, blue discoloration of the right thumb and pointer finger since September, with associated pain and skin changes. Symptoms exacerbated by cold exposure. Nifedipine was previously prescribed but discontinued due to increased migraines. Recent  onset of numbness in the right ring toe and mottled discoloration on the right leg. -Serology workup as above for inflammation markers, cryoglobulins, ANA, and APS screening -Could consider alternate vasodilators such as amlodipine or PDE5 inhibitor but may share migraine problems that she had with nifedipine -Consider vascular studies if symptoms worsen or do not improve. -Advise to avoid caffeine and other stimulants. -Recommend keeping both core body and hands warm.  Migraines Increased frequency of migraines after starting Nifedipine, less so now.  Endometriosis Chronic condition, no current complaints or changes.   Orders: Orders Placed This Encounter  Procedures   ANA   Beta-2  glycoprotein antibodies   Cardiolipin antibodies, IgG, IgM, IgA   Sedimentation rate   C-reactive protein   C3 and C4   Cryoglobulin   No orders of the defined types were placed in this encounter.    Follow-Up Instructions: Return in about 4 weeks (around 05/12/2023) for New pt Raynaud's f/u 66mo.   Lonni LELON Ester, MD  Note - This record has been created using [employer].  Chart creation errors have been sought, but may not always  have been located. Such creation errors do not reflect on  the standard of medical care.

## 2023-04-21 LAB — CARDIOLIPIN ANTIBODIES, IGG, IGM, IGA
Anticardiolipin IgA: <2 [APL'U]/mL (ref <20.0)
Anticardiolipin IgG: <2 [GPL'U]/mL (ref <20.0)
Anticardiolipin IgM: 3.3 [MPL'U]/mL (ref <20.0)

## 2023-04-21 LAB — SEDIMENTATION RATE: Sed Rate: 11 mm/h (ref 0–20)

## 2023-04-21 LAB — C3 AND C4
C3 Complement: 154 mg/dL (ref 83–193)
C4 Complement: 41 mg/dL (ref 15–57)

## 2023-04-21 LAB — BETA-2 GLYCOPROTEIN ANTIBODIES
Beta-2 Glyco 1 IgA: <2 U/mL (ref <20.0)
Beta-2 Glyco 1 IgM: 2.7 U/mL (ref <20.0)
Beta-2 Glyco I IgG: <2 U/mL (ref <20.0)

## 2023-04-21 LAB — ANA: Anti Nuclear Antibody (ANA): NEGATIVE

## 2023-04-21 LAB — C-REACTIVE PROTEIN: CRP: 8.6 mg/L — ABNORMAL HIGH (ref <8.0)

## 2023-04-21 LAB — CRYOGLOBULIN

## 2023-04-25 ENCOUNTER — Telehealth: Payer: Self-pay | Admitting: *Deleted

## 2023-04-25 NOTE — Telephone Encounter (Signed)
Patient contacted the office and left a message stating with the lower temperatures outside she is having pain in her hands that is sharp and goes up her arms into her chest. Patient states the pain is not heart related. Patient states she is also having numbness in her toes. Patient states she is wearing her gloves. Patient states she would like to know what she should do. Please advise.

## 2023-04-27 ENCOUNTER — Telehealth: Payer: Self-pay | Admitting: Internal Medicine

## 2023-04-27 NOTE — Telephone Encounter (Signed)
Pt states she is cold. Pt does not know why and would like to talk to someone about this. Pt stated that she has her heat on to 75 degrees and is still numb in her fingers. Pt would like a phone call back

## 2023-05-01 ENCOUNTER — Telehealth: Payer: Self-pay | Admitting: Internal Medicine

## 2023-05-01 DIAGNOSIS — I7389 Other specified peripheral vascular diseases: Secondary | ICD-10-CM

## 2023-05-01 DIAGNOSIS — R7982 Elevated C-reactive protein (CRP): Secondary | ICD-10-CM

## 2023-05-01 MED ORDER — METHYLPREDNISOLONE 4 MG PO TBPK: ORAL_TABLET | ORAL | 0 refills | Status: DC

## 2023-05-01 NOTE — Telephone Encounter (Signed)
Patient advised Lab results form our visit were pretty unremarkable. The CRP was slightly elevated at 8.6 but this is not specific and 8 is normal. For her circulation problems we could try some different medication than the nifedipine but Dr. Dimple Casey would like to see what the vascular specialists find if anything next month.   Increased body pains don't seem to be explained by a circulation problem. We could try a short course of an oral steroid medication as something stronger than naproxen to see if these symptoms improve. Dr. Dimple Casey would try a steroid taper for 6 days and see how much and for how long it helps. Patient verbalized understanding. Patient states she will try the steroid taper.

## 2023-05-01 NOTE — Telephone Encounter (Signed)
Patient contacted the office and states she has aching joints and she feels like she cannot bend or move them. Patient states she is having the aching feeling in her back, hips, knees, elbows, and hands. Patient states she is taking naproxen and it is not helping. Please advise.

## 2023-05-01 NOTE — Telephone Encounter (Signed)
Pt called stating she is scared and needs to speak with someone about this pain she's having. Pt states she's been waiting since last week to hear from someone. Pt states her toes,toes and knees are numb, everything is sore and is in pain.

## 2023-05-01 NOTE — Telephone Encounter (Signed)
Lab results form our visit were pretty unremarkable. The CRP was slightly elevated at 8.6 but this is not specific and 8 is normal. For her circulation problems we could try some different medication than the nifedipine but I would like to see what the vascular specialists find if anything next month.  Increased body pains don't seem to be explained by a circulation problem. We could try a short course of an oral steroid medication as something stronger than naproxen to see if these symptoms improve. I would try a steroid taper for 6 days and see how much and for how long it helps.

## 2023-06-08 ENCOUNTER — Ambulatory Visit: Payer: 59 | Admitting: Internal Medicine

## 2023-06-08 NOTE — Progress Notes (Deleted)
 Office Visit Note  Patient: Toni Fields             Date of Birth: 1986/02/04           MRN: [id number]             PCP: Alois Cliche, MD Referring: Alois Cliche, MD Visit Date: 06/08/2023   Subjective:  No chief complaint on file.   History of Present Illness: Toni Fields is a 38 y.o. female here for follow up ***   Previous HPI 04/14/23 Toni Fields is a 38 y.o. female with a history of migraines and endometriosis, presents with a new onset of peripheral circulation issues primarily affecting the right hand. The symptoms began in September, initially presenting as coldness in the thumb out of proportion to fairly warm weather. 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 Over the following weeks, the patient noticed the thumb turning blue and the skin peeling back. As the weather cooled, the cold sensation spread to the pointer finger. The patient describes a cold pain that radiates up the arm, making it difficult to lift. The pain eventually subsides, but the finger remains cold and discolored, alternating between blue and a pale white. The patient reports difficulty applying pressure with the hand, affecting daily activities such as opening items and typing.   The patient was prescribed nifedipine for the circulation issues, but discontinued the medication due to an increase in migraine frequency. Recently, the patient has also noticed a discoloration in the leg and numbness in the ring toe. The patient denies any history of blood clots, smoking, or use of stimulants other than a daily Pepsi. The patient also reports a history of endometriosis, but it is unclear if this is related to the current symptoms.   The patient has an upcoming appointment with a cardiologist to further investigate these symptoms. The patient has been managing the symptoms by keeping the hand warm with rechargeable hand warmers and gloves.    No Rheumatology ROS completed.   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   Chronic migraine without aura without status migrainosus, not intractable 10/30/2019   Dysmenorrhea 08/02/2011   Endometriosis 08/02/2011   Infertility, female 08/02/2011   Hidradenitis 08/02/2011   Obesity 08/02/2011    Past Medical History:  Diagnosis Date   Abdominal pain    Acute biliary pancreatitis    Depression    Diverticulitis    Dysmenorrhea    Endometriosis    Hernia, hiatal    HPV in female    IBS (irritable bowel syndrome)    IC (interstitial cystitis)    Migraine    Ovarian cyst    resolved    Family History  Problem Relation Age of Onset   Diabetes Mother    Hypertension Mother    Hyperlipidemia Mother    Breast cancer Mother    Heart disease Mother    Heart disease Father    Heart attack Father    Hypertension Father    Migraines Sister    Breast cancer Maternal Grandmother    Cervical cancer Maternal Grandmother    Ovarian cancer Maternal Grandmother    Diabetes Maternal Grandfather    Cancer Paternal Grandmother    Diabetes Paternal Grandfather    Breast cancer Maternal Aunt    Heart disease Paternal Uncle    Heart disease Paternal Uncle    Diabetes Paternal Uncle    Heart  disease Paternal Uncle    Cancer Paternal Uncle        pancreatic   Heart disease Paternal Uncle    Diabetes Paternal Uncle    Colon cancer Neg Hx    Past Surgical History:  Procedure Laterality Date   BLADDER SURGERY  2005   CHOLECYSTECTOMY  2016   CYSTOSCOPY     ERCP  2019   EXPLORATORY LAPAROTOMY  4/12   endometrosis   NASAL ENDOSCOPY     WISDOM TOOTH EXTRACTION     Social History   Social History Narrative   Lives at home alone   Right handed   Caffeine: 1 soda or tea per day    Immunization History  Administered Date(s) Administered   [employer] Vaccination 08/22/2019, 09/17/2019     Objective: Vital Signs: There were no vitals taken for this visit.   Physical  Exam   Musculoskeletal Exam: ***  CDAI Exam: CDAI Score: -- Patient Global: --; Provider Global: -- Swollen: --; Tender: -- Joint Exam 06/08/2023   No joint exam has been documented for this visit   There is currently no information documented on the homunculus. Go to the Rheumatology activity and complete the homunculus joint exam.  Investigation: No additional findings.  Imaging: No results found.  Recent Labs: Lab Results  Component Value Date   WBC 10.1 02/16/2023   HGB 13.9 02/16/2023   PLT 316 02/16/2023   NA 134 (L) 02/16/2023   K 3.3 (L) 02/16/2023   CL 107 02/16/2023   CO2 20 (L) 02/16/2023   GLUCOSE 109 (H) 02/16/2023   BUN 14 02/16/2023   CREATININE 0.80 02/16/2023   BILITOT 0.5 10/25/2021   ALKPHOS 75 10/25/2021   AST 14 (L) 10/25/2021   ALT 13 10/25/2021   PROT 6.6 10/25/2021   ALBUMIN 3.3 (L) 10/25/2021   CALCIUM 8.6 (L) 02/16/2023   GFRAA 131 10/30/2019    Speciality Comments: No specialty comments available.  Procedures:  No procedures performed Allergies: Amoxicillin-pot clavulanate, Sulfa antibiotics, and Latex   Assessment / Plan:     Visit Diagnoses: No diagnosis found.  ***  Orders: No orders of the defined types were placed in this encounter.  No orders of the defined types were placed in this encounter.    Follow-Up Instructions: No follow-ups on file.   Fuller Plan, MD  Note - This record has been created using [employer].  Chart creation errors have been sought, but may not always  have been located. Such creation errors do not reflect on  the standard of medical care.

## 2023-06-16 ENCOUNTER — Encounter: Payer: BC Managed Care – PPO | Admitting: Internal Medicine

## 2023-07-31 ENCOUNTER — Encounter (HOSPITAL_BASED_OUTPATIENT_CLINIC_OR_DEPARTMENT_OTHER): Payer: Self-pay | Admitting: Emergency Medicine

## 2023-07-31 ENCOUNTER — Emergency Department (HOSPITAL_BASED_OUTPATIENT_CLINIC_OR_DEPARTMENT_OTHER): Payer: Self-pay

## 2023-07-31 ENCOUNTER — Other Ambulatory Visit: Payer: Self-pay

## 2023-07-31 ENCOUNTER — Emergency Department (HOSPITAL_BASED_OUTPATIENT_CLINIC_OR_DEPARTMENT_OTHER)
Admission: EM | Admit: 2023-07-31 | Discharge: 2023-08-01 | Disposition: A | Discharge status: Home / Self Care | Payer: Self-pay | Attending: Emergency Medicine | Admitting: Emergency Medicine

## 2023-07-31 DIAGNOSIS — Z91040 Latex allergy status: Secondary | ICD-10-CM | POA: Insufficient documentation

## 2023-07-31 DIAGNOSIS — S62524A Nondisplaced fracture of distal phalanx of right thumb, initial encounter for closed fracture: Secondary | ICD-10-CM | POA: Insufficient documentation

## 2023-07-31 DIAGNOSIS — W133XXA Fall through floor, initial encounter: Secondary | ICD-10-CM | POA: Insufficient documentation

## 2023-07-31 DIAGNOSIS — M79644 Pain in right finger(s): Secondary | ICD-10-CM | POA: Insufficient documentation

## 2023-07-31 NOTE — ED Provider Notes (Signed)
 [facility] Provider Note   CSN: [id number] Arrival date & time: 07/31/23  2310     History  Chief Complaint  Patient presents with   Finger Injury    Toni Fields is a 38 y.o. female.  Patient is a 38 year old female presenting with a right thumb injury.  She was dancing 2 evenings ago when she slipped on the dance floor and fell, injuring her thumb.  It has been swollen and painful since.  Pain is worse with movement and palpation.  No alleviating factors.       Home Medications Prior to Admission medications   Medication Sig Start Date End Date Taking? Authorizing Provider  folic acid (FOLVITE) 1 MG tablet Take 1 mg by mouth daily.    [provider]  Fremanezumab -vfrm (AJOVY ) 225 MG/1.5ML SOAJ Inject 225 mg into the skin every 30 (thirty) days. Patient not taking: Reported on 04/14/2023 10/30/19   Glory Larsen, MD  methocarbamol  (ROBAXIN ) 500 MG tablet Take 1 tablet (500 mg total) by mouth 2 (two) times daily. Patient not taking: Reported on 04/14/2023 06/10/22   Charmayne Cooper, MD  methylPREDNISolone  (MEDROL  DOSEPAK) 4 MG TBPK tablet Take tablets by mouth once daily with food: decreasing 6, 5, 4, 3, 2, then 1 tablet daily 05/01/23   Rice, Haig Levan, MD  Multiple Vitamin (MULTIVITAMIN PO) Take by mouth daily.    [provider]  naproxen  (NAPROSYN ) 500 MG tablet Take 1 tablet (500 mg total) by mouth 2 (two) times daily. Patient not taking: Reported on 04/14/2023 06/10/22   Charmayne Cooper, MD  norethindrone  (MICRONOR ,CAMILA ,ERRIN ) 0.35 MG tablet Take by mouth. 10/06/17   [provider]      Allergies    Amoxicillin-pot clavulanate, Sulfa antibiotics, Benadryl  [diphenhydramine ], and Latex    Review of Systems   Review of Systems  All other systems reviewed and are negative.   Physical Exam Updated Vital Signs BP (!) 128/91 (BP Location: Left Arm)   Pulse 85   Temp 97.8 F (36.6 C)    Resp 18   SpO2 99%  Physical Exam Vitals and nursing note reviewed.  Constitutional:      Appearance: Normal appearance.  Pulmonary:     Effort: Pulmonary effort is normal.  Musculoskeletal:     Comments: The right thumb is noted to be swollen in the area of the distal phalanx/thumb pad.  Skin:    General: Skin is warm and dry.  Neurological:     Mental Status: She is alert and oriented to person, place, and time.     ED Results / Procedures / Treatments   Labs (all labs ordered are listed, but only abnormal results are displayed) Labs Reviewed - No data to display  EKG None  Radiology DG Finger Thumb Right Result Date: 07/31/2023 EXAM: XRAY VIEW(S) XRAY OF THE RIGHT THUMB 07/31/2023 11:31:00 PM COMPARISON: None available. CLINICAL HISTORY: Injury. Patient states she was dancing and got tripped up and fell. Right thumb swollen. Patient shielded. LMP=07/08/23. FINDINGS: BONES AND JOINTS: No acute fracture or focal osseous lesion. No joint dislocation. SOFT TISSUES: The soft tissues are unremarkable. IMPRESSION: 1. No significant abnormality. Electronically signed by: Zadie Herter MD 07/31/2023 11:50 PM EDT RP Workstation: [id number]    Procedures Procedures    Medications Ordered in ED Medications - No data to display  ED Course/ Medical Decision Making/ A&P  Patient presenting with a right thumb injury as described in the HPI.  X-rays show a nondisplaced fracture of the distal phalanx.  Patient will be placed in a thumb spica splint and is to ice, rest, and follow-up as needed.  Final Clinical Impression(s) / ED Diagnoses Final diagnoses:  None    Rx / DC Orders ED Discharge Orders     None         Orvilla Blander, MD 08/01/23 0004

## 2023-07-31 NOTE — ED Triage Notes (Signed)
 Patient presents with right thumb pain s/p fall yesterday. Bruising noted to same.

## 2023-08-01 NOTE — Discharge Instructions (Signed)
 You have a nondisplaced fracture in the distal phalanx of your right thumb.  This will take several weeks to heal.  Wear thumb spica splint for comfort and support.  Take ibuprofen  600 mg every 6 hours as needed for pain.

## 2024-04-08 ENCOUNTER — Inpatient Hospital Stay (HOSPITAL_COMMUNITY)
Admission: AD | Admit: 2024-04-08 | Discharge: 2024-04-08 | Disposition: A | Discharge status: Home / Self Care | Payer: Self-pay | Attending: Obstetrics & Gynecology | Admitting: Obstetrics & Gynecology

## 2024-04-08 ENCOUNTER — Inpatient Hospital Stay (HOSPITAL_COMMUNITY): Payer: Self-pay

## 2024-04-08 ENCOUNTER — Encounter (HOSPITAL_COMMUNITY): Payer: Self-pay | Admitting: Obstetrics

## 2024-04-08 ENCOUNTER — Other Ambulatory Visit: Payer: Self-pay

## 2024-04-08 DIAGNOSIS — O039 Complete or unspecified spontaneous abortion without complication: Secondary | ICD-10-CM | POA: Insufficient documentation

## 2024-04-08 LAB — CBC
HCT: 38.7 % (ref 36.0–46.0)
Hemoglobin: 13.3 g/dL (ref 12.0–15.0)
MCH: 31.8 pg (ref 26.0–34.0)
MCHC: 34.4 g/dL (ref 30.0–36.0)
MCV: 92.6 fL (ref 80.0–100.0)
Platelets: 299 K/uL (ref 150–400)
RBC: 4.18 MIL/uL (ref 3.87–5.11)
RDW: 12.4 % (ref 11.5–15.5)
WBC: 12.1 K/uL — ABNORMAL HIGH (ref 4.0–10.5)
nRBC: 0 % (ref 0.0–0.2)

## 2024-04-08 LAB — URINALYSIS, ROUTINE W REFLEX MICROSCOPIC
Bilirubin Urine: NEGATIVE
Glucose, UA: NEGATIVE mg/dL
Ketones, ur: NEGATIVE mg/dL
Leukocytes,Ua: NEGATIVE
Nitrite: NEGATIVE
Protein, ur: 30 mg/dL — AB
Specific Gravity, Urine: 1.027 (ref 1.005–1.030)
pH: 5 (ref 5.0–8.0)

## 2024-04-08 LAB — WET PREP, GENITAL
Sperm: NONE SEEN
Trich, Wet Prep: NONE SEEN
WBC, Wet Prep HPF POC: >=10 — AB
Yeast Wet Prep HPF POC: NONE SEEN

## 2024-04-08 LAB — HCG, QUANTITATIVE, PREGNANCY: hCG, Beta Chain, Quant, S: 37520 m[IU]/mL — ABNORMAL HIGH

## 2024-04-08 LAB — ABO/RH: ABO/RH(D): O POS

## 2024-04-08 NOTE — MAU Note (Signed)
 Toni Fields is a 39 y.o. at Unknown here in MAU reporting: she thinks she's miscarrying, she's having lower abdominal and back pain, states it's like a ring going around.  Reports pain is a stabbing, aching pulling pain that's has been unrelieved with Ibuprofen  (last taken @ 1200).  Also states has moderate VB that's malodorous.  Reports symptoms began yesterday.    LMP: 02/10/2024 Onset of complaint: yesterday Pain score: 8 Vitals:   04/08/24 1719  BP: 113/81  Pulse: 100  Resp: 19  Temp: 98.4 F (36.9 C)  SpO2: 100%     FHT: NA  Lab orders placed from triage: UPT

## 2024-04-08 NOTE — MAU Provider Note (Signed)
 " History     [id number]  Arrival date and time: 04/08/24 1540    Chief Complaint  Patient presents with   Vaginal Bleeding   Vaginal Odor   Back Pain   Abdominal Pain     HPI Toni Fields is a 39 y.o. at [redacted]w[redacted]d who presents for vaginal bleeding. LMP was 02/10/24. She had HPT positive around Christmas. She was having lower abdominal pain and back pain with a moderate amount of VB with passing of large clots. While sitting in the MAU, she states that she has soaked a pad and passed two large clots. Bleeding started today. She is on COC's for endometriosis but stopped taking them when she found out she was pregnant. Concerned she is having a miscarriage.   --/--/O POS Performed at [facility], [address] [place], [place] [address]  [phone])  Past Medical History:  Diagnosis Date   Abdominal pain    Acute biliary pancreatitis    Depression    Diverticulitis    Dysmenorrhea    Endometriosis    Hernia, hiatal    HPV in female    IBS (irritable bowel syndrome)    IC (interstitial cystitis)    Migraine    Ovarian cyst    resolved    Past Surgical History:  Procedure Laterality Date   BLADDER SURGERY  2005   CHOLECYSTECTOMY  2016   CYSTOSCOPY     ERCP  2019   EXPLORATORY LAPAROTOMY  4/12   endometrosis   NASAL ENDOSCOPY     WISDOM TOOTH EXTRACTION      Family History  Problem Relation Age of Onset   Diabetes Mother    Hypertension Mother    Hyperlipidemia Mother    Breast cancer Mother    Heart disease Mother    Heart disease Father    Heart attack Father    Hypertension Father    Migraines Sister    Breast cancer Maternal Grandmother    Cervical cancer Maternal Grandmother    Ovarian cancer Maternal Grandmother    Diabetes Maternal Grandfather    Cancer Paternal Grandmother    Diabetes Paternal Grandfather    Breast cancer Maternal Aunt    Heart disease Paternal Uncle    Heart disease Paternal Uncle    Diabetes Paternal Uncle    Heart  disease Paternal Uncle    Cancer Paternal Uncle        pancreatic   Heart disease Paternal Uncle    Diabetes Paternal Uncle    Colon cancer Neg Hx     Social History   Socioeconomic History   Marital status: Divorced    Spouse name: Not on file   Number of children: 0   Years of education: 2 yrs college   Highest education level: Not on file  Occupational History   Not on file  Tobacco Use   Smoking status: Never    Passive exposure: Past   Smokeless tobacco: Never  Vaping Use   Vaping status: Never Used  Substance and Sexual Activity   Alcohol use: Yes    Comment: occasional   Drug use: Never   Sexual activity: Not on file  Other Topics Concern   Not on file  Social History Narrative   Lives at home alone   Right handed   Caffeine: 1 soda or tea per day    Social Drivers of Health   Tobacco Use: Low Risk (07/31/2023)   Patient History  Smoking Tobacco Use: Never    Smokeless Tobacco Use: Never    Passive Exposure: Past  Recent Concern: Tobacco Use - Medium Risk (05/22/2023)   Received from [facility]   Patient History    Smoking Tobacco Use: Former    Smokeless Tobacco Use: Never    Passive Exposure: Not on file  Financial Resource Strain: Not on file  Food Insecurity: Low Risk (05/22/2023)   Received from [facility]   Epic    Within the past 12 months, you worried that your food would run out before you got money to buy more: Never true    Within the past 12 months, the food you bought just didn't last and you didn't have money to get more. : Never true  Transportation Needs: No Transportation Needs (05/22/2023)   Received from [facility]    In the past 12 months, has lack of reliable transportation kept you from medical appointments, meetings, work or from getting things needed for daily living? : No  Physical Activity: Not on file  Stress: Not on file  Social Connections: Unknown (08/07/2021)   Received from [facility]    Social Network    Social Network: Not on file  Intimate Partner Violence: Unknown (07/05/2021)   Received from [facility]   HITS    Physically Hurt: Not on file    Insult or Talk Down To: Not on file    Threaten Physical Harm: Not on file    Scream or Curse: Not on file  Depression (EYV7-0): Not on file  Alcohol Screen: Not on file  Housing: Low Risk (05/22/2023)   Received from [facility]   Epic    What is your living situation today?: I have a steady place to live    Think about the place you live. Do you have problems with any of the following? Choose all that apply:: None/None on this list  Utilities: Low Risk (05/22/2023)   Received from [facility]   Utilities    In the past 12 months has the electric, gas, oil, or water company threatened to shut off services in your home? : No  Health Literacy: Not on file    Allergies[1]  Medications Ordered Prior to Encounter[2]  Pertinent positives and negative per HPI, all others reviewed and negative  Physical Exam   BP 113/81 (BP Location: Right Arm)   Pulse 100   Temp 98.4 F (36.9 C) (Oral)   Resp 19   Ht 5' 6 (1.676 m)   Wt 97.4 kg   LMP 02/10/2024   SpO2 100%   BMI 34.65 kg/m   Patient Vitals for the past 24 hrs:  BP Temp Temp src Pulse Resp SpO2 Height Weight  04/08/24 1719 113/81 98.4 F (36.9 C) Oral 100 19 100 % -- --  04/08/24 1711 -- -- -- -- -- -- 5' 6 (1.676 m) 97.4 kg    Physical Exam Vitals and nursing note reviewed.  Constitutional:      Appearance: She is well-developed.  HENT:     Head: Normocephalic and atraumatic.     Mouth/Throat:     Mouth: Mucous membranes are moist.  Eyes:     Extraocular Movements: Extraocular movements intact.  Cardiovascular:     Rate and Rhythm: Normal rate and regular rhythm.  Pulmonary:     Effort: Pulmonary effort is normal.  Abdominal:     Palpations: Abdomen is soft.     Tenderness: There is no  abdominal tenderness.  Skin:    Capillary Refill:  Capillary refill takes less than 2 seconds.  Neurological:     General: No focal deficit present.     Mental Status: She is alert.      Labs Results for orders placed or performed during the hospital encounter of 04/08/24 (from the past 24 hours)  Wet prep, genital     Status: Abnormal   Collection Time: 04/08/24  5:31 PM  Result Value Ref Range   Yeast Wet Prep HPF POC NONE SEEN NONE SEEN   Trich, Wet Prep NONE SEEN NONE SEEN   Clue Cells Wet Prep HPF POC PRESENT (A) NONE SEEN   WBC, Wet Prep HPF POC >=10 (A) <10   Sperm NONE SEEN   Urinalysis, Routine w reflex microscopic -Urine, Clean Catch     Status: Abnormal   Collection Time: 04/08/24  5:49 PM  Result Value Ref Range   Color, Urine YELLOW YELLOW   APPearance HAZY (A) CLEAR   Specific Gravity, Urine 1.027 1.005 - 1.030   pH 5.0 5.0 - 8.0   Glucose, UA NEGATIVE NEGATIVE mg/dL   Hgb urine dipstick MODERATE (A) NEGATIVE   Bilirubin Urine NEGATIVE NEGATIVE   Ketones, ur NEGATIVE NEGATIVE mg/dL   Protein, ur 30 (A) NEGATIVE mg/dL   Nitrite NEGATIVE NEGATIVE   Leukocytes,Ua NEGATIVE NEGATIVE   RBC / HPF 0-5 0 - 5 RBC/hpf   WBC, UA 0-5 0 - 5 WBC/hpf   Bacteria, UA RARE (A) NONE SEEN   Squamous Epithelial / HPF 0-5 0 - 5 /HPF   Mucus PRESENT    Ca Oxalate Crys, UA PRESENT   ABO/Rh     Status: None   Collection Time: 04/08/24  7:14 PM  Result Value Ref Range   ABO/RH(D)      O POS Performed at [facility], [address], [place], [place] [address]   CBC     Status: Abnormal   Collection Time: 04/08/24  7:16 PM  Result Value Ref Range   WBC 12.1 (H) 4.0 - 10.5 K/uL   RBC 4.18 3.87 - 5.11 MIL/uL   Hemoglobin 13.3 12.0 - 15.0 g/dL   HCT 61.2 63.9 - 53.9 %   MCV 92.6 80.0 - 100.0 fL   MCH 31.8 26.0 - 34.0 pg   MCHC 34.4 30.0 - 36.0 g/dL   RDW 87.5 88.4 - 84.4 %   Platelets 299 150 - 400 K/uL   nRBC 0.0 0.0 - 0.2 %  hCG, quantitative, pregnancy     Status: Abnormal   Collection Time: 04/08/24  7:16 PM   Result Value Ref Range   hCG, Beta Chain, Quant, S 37,520 (H) <5 mIU/mL    Imaging US  OB LESS THAN 14 WEEKS WITH OB TRANSVAGINAL Result Date: 04/08/2024 EXAM: ULTRASOUND FIRST TRIMESTER TECHNIQUE: Transabdominal and Transvaginal first trimester obstetric pelvic duplex ultrasound was performed with real-time imaging, color flow Doppler imaging, and spectral analysis. COMPARISON: None available. CLINICAL HISTORY: Abdominal pain during pregnancy in first trimester. FINDINGS: UTERUS: No focal myometrial mass. Heterogeneously thickened endometrium measuring 2.4 cm thick. Fluid in the endometrial canal, likely representing blood products. GESTATIONAL SAC(S): No intrauterine gestational sac. YOLK SAC: Not identified. EMBRYO(<11WK) /FETUS(>=11WK): Not identified. CROWN RUMP LENGTH: Not measured. RATE OF CARDIAC ACTIVITY: Not measured. RIGHT OVARY: 1.8 cm isoechoic structure in the right ovary, possibly a collapsed follicle or corpus luteum. LEFT OVARY: . Normal . FREE FLUID: No free fluid. MEASUREMENTS ESTIMATED GESTATIONAL AGE BY CURRENT  ULTRASOUND: Not applicable. ESTIMATED GESTATIONAL AGE BY LMP/PRIOR ULTRASOUND: Not provided. ESTIMATED DUE DATE: Not applicable. IMPRESSION: 1. No intrauterine gestational sac. 2. Heterogeneously thickened endometrium measuring 2.4 cm with fluid in the endometrial canal, likely representing blood products. Electronically signed by: Rogelia Myers MD 04/08/2024 09:14 PM EST RP Workstation: [id number]    MAU Course  Procedures  Lab Orders         Wet prep, genital         Urinalysis, Routine w reflex microscopic -Urine, Clean Catch         CBC         hCG, quantitative, pregnancy    No orders of the defined types were placed in this encounter.   Imaging Orders         US  OB LESS THAN 14 WEEKS WITH OB TRANSVAGINAL     MDM Moderate (Level 3-4)  Assessment and Plan  Miscarriage   Laasia Amparan is a 39 y.o. at [redacted]w[redacted]d who presents for vaginal bleeding.   -CBC  without evidence of acute blood loss -HCG consistent with LMP -U/A without evidence of infectious process -GC/Ch in progress -Wet prep positive for clue cells. -US  results consistent with active miscarriage  -Stable for discharge home -Discussed expectant management and clinical course vs medication assistance. Patient opts for expectant management at this time -Discussed possibility of BV but will defer treatment until after miscarriage completes. -All questions answered, anticipatory guidance and detailed bleeding return precautions provided.      Chuong Casebeer L Brayant Dorr, MD/MHA 04/08/2024 9:47 PM  Allergies as of 04/08/2024       Reactions   Benadryl  [diphenhydramine ] Other (See Comments)   Latex Rash        Medication List     STOP taking these medications    Ajovy  225 MG/1.5ML Soaj Generic drug: Fremanezumab -vfrm   folic acid 1 MG tablet Commonly known as: FOLVITE   methocarbamol  500 MG tablet Commonly known as: ROBAXIN    methylPREDNISolone  4 MG Tbpk tablet Commonly known as: MEDROL  DOSEPAK   MULTIVITAMIN PO   naproxen  500 MG tablet Commonly known as: NAPROSYN    norethindrone  0.35 MG tablet Commonly known as: MICRONOR            [1]  Allergies Allergen Reactions   Benadryl  [Diphenhydramine ] Other (See Comments)   Latex Rash  [2]  No current facility-administered medications on file prior to encounter.   Current Outpatient Medications on File Prior to Encounter  Medication Sig Dispense Refill   folic acid (FOLVITE) 1 MG tablet Take 1 mg by mouth daily.     Fremanezumab -vfrm (AJOVY ) 225 MG/1.5ML SOAJ Inject 225 mg into the skin every 30 (thirty) days. (Patient not taking: Reported on 04/14/2023) 3 pen 11   methocarbamol  (ROBAXIN ) 500 MG tablet Take 1 tablet (500 mg total) by mouth 2 (two) times daily. (Patient not taking: Reported on 04/14/2023) 20 tablet 0   methylPREDNISolone  (MEDROL  DOSEPAK) 4 MG TBPK tablet Take tablets by mouth once daily with food:  decreasing 6, 5, 4, 3, 2, then 1 tablet daily 21 tablet 0   Multiple Vitamin (MULTIVITAMIN PO) Take by mouth daily.     naproxen  (NAPROSYN ) 500 MG tablet Take 1 tablet (500 mg total) by mouth 2 (two) times daily. (Patient not taking: Reported on 04/14/2023) 30 tablet 0   norethindrone  (MICRONOR ,CAMILA ,ERRIN ) 0.35 MG tablet Take by mouth.     "

## 2024-04-08 NOTE — Progress Notes (Signed)
 Pt called to be triaged, not sitting in lobby.

## 2024-04-09 LAB — GC/CHLAMYDIA PROBE AMP (~~LOC~~) NOT AT ARMC
Chlamydia: NEGATIVE
Comment: NEGATIVE
Comment: NORMAL
Neisseria Gonorrhea: NEGATIVE

## 2024-04-09 LAB — POCT PREGNANCY, URINE: Preg Test, Ur: POSITIVE — AB
